# Patient Record
Sex: Male | Born: 1955 | Race: White | Hispanic: No | Marital: Married | Smoking: Never smoker
Health system: Southern US, Community
[De-identification: ages and names within clinical notes are randomized; demographics above are authoritative.]

## PROBLEM LIST (undated history)

## (undated) DIAGNOSIS — Z9911 Dependence on respirator [ventilator] status: Secondary | ICD-10-CM

## (undated) DIAGNOSIS — E119 Type 2 diabetes mellitus without complications: Secondary | ICD-10-CM

## (undated) DIAGNOSIS — Z87442 Personal history of urinary calculi: Secondary | ICD-10-CM

## (undated) DIAGNOSIS — G7000 Myasthenia gravis without (acute) exacerbation: Secondary | ICD-10-CM

## (undated) DIAGNOSIS — E669 Obesity, unspecified: Secondary | ICD-10-CM

## (undated) DIAGNOSIS — H539 Unspecified visual disturbance: Secondary | ICD-10-CM

## (undated) HISTORY — DX: Unspecified visual disturbance: H53.9

## (undated) HISTORY — DX: Type 2 diabetes mellitus without complications: E11.9

## (undated) HISTORY — DX: Dependence on respirator (ventilator) status: Z99.11

## (undated) HISTORY — DX: Personal history of urinary calculi: Z87.442

## (undated) HISTORY — DX: Obesity, unspecified: E66.9

## (undated) HISTORY — PX: THYMECTOMY: SHX1063

## (undated) HISTORY — DX: Myasthenia gravis without (acute) exacerbation: G70.00

## (undated) HISTORY — PX: TONSILLECTOMY: SUR1361

---

## 2008-11-30 ENCOUNTER — Inpatient Hospital Stay: Payer: Self-pay | Admitting: Internal Medicine

## 2008-12-03 ENCOUNTER — Emergency Department: Payer: Self-pay | Admitting: Unknown Physician Specialty

## 2008-12-03 ENCOUNTER — Inpatient Hospital Stay (HOSPITAL_COMMUNITY): Admission: EM | Admit: 2008-12-03 | Discharge: 2008-12-14 | Payer: Self-pay | Admitting: Pulmonary Disease

## 2008-12-03 ENCOUNTER — Ambulatory Visit: Payer: Self-pay | Admitting: Pulmonary Disease

## 2008-12-12 ENCOUNTER — Ambulatory Visit: Payer: Self-pay | Admitting: Thoracic Surgery

## 2008-12-15 ENCOUNTER — Encounter (HOSPITAL_COMMUNITY): Admission: RE | Admit: 2008-12-15 | Discharge: 2009-03-15 | Payer: Self-pay | Admitting: Neurology

## 2008-12-19 ENCOUNTER — Inpatient Hospital Stay (HOSPITAL_COMMUNITY): Admission: RE | Admit: 2008-12-19 | Discharge: 2008-12-23 | Payer: Self-pay | Admitting: Thoracic Surgery

## 2008-12-19 ENCOUNTER — Encounter: Payer: Self-pay | Admitting: Thoracic Surgery

## 2008-12-28 ENCOUNTER — Ambulatory Visit: Payer: Self-pay | Admitting: Thoracic Surgery

## 2008-12-28 ENCOUNTER — Encounter: Admission: RE | Admit: 2008-12-28 | Discharge: 2008-12-28 | Payer: Self-pay | Admitting: Thoracic Surgery

## 2009-01-12 ENCOUNTER — Ambulatory Visit (HOSPITAL_COMMUNITY): Admission: AD | Admit: 2009-01-12 | Discharge: 2009-01-12 | Payer: Self-pay | Admitting: Neurology

## 2009-01-18 ENCOUNTER — Encounter: Admission: RE | Admit: 2009-01-18 | Discharge: 2009-01-18 | Payer: Self-pay | Admitting: Thoracic Surgery

## 2009-01-18 ENCOUNTER — Ambulatory Visit: Payer: Self-pay | Admitting: Thoracic Surgery

## 2009-02-06 ENCOUNTER — Ambulatory Visit (HOSPITAL_COMMUNITY): Admission: RE | Admit: 2009-02-06 | Discharge: 2009-02-06 | Payer: Self-pay | Admitting: Neurology

## 2009-02-15 ENCOUNTER — Ambulatory Visit (HOSPITAL_COMMUNITY): Admission: RE | Admit: 2009-02-15 | Discharge: 2009-02-15 | Payer: Self-pay | Admitting: Neurology

## 2009-02-15 ENCOUNTER — Ambulatory Visit: Payer: Self-pay | Admitting: Thoracic Surgery

## 2009-02-15 ENCOUNTER — Encounter: Admission: RE | Admit: 2009-02-15 | Discharge: 2009-02-15 | Payer: Self-pay | Admitting: Thoracic Surgery

## 2009-04-19 ENCOUNTER — Encounter: Admission: RE | Admit: 2009-04-19 | Discharge: 2009-04-19 | Payer: Self-pay | Admitting: Thoracic Surgery

## 2009-04-19 ENCOUNTER — Ambulatory Visit: Payer: Self-pay | Admitting: Thoracic Surgery

## 2009-11-02 ENCOUNTER — Inpatient Hospital Stay (HOSPITAL_COMMUNITY): Admission: AD | Admit: 2009-11-02 | Discharge: 2009-11-06 | Payer: Self-pay | Admitting: Neurology

## 2009-11-07 ENCOUNTER — Encounter (HOSPITAL_COMMUNITY): Admission: RE | Admit: 2009-11-07 | Discharge: 2010-01-09 | Payer: Self-pay | Admitting: Neurology

## 2009-11-27 ENCOUNTER — Ambulatory Visit (HOSPITAL_COMMUNITY): Admission: RE | Admit: 2009-11-27 | Discharge: 2009-11-27 | Payer: Self-pay | Admitting: Neurology

## 2010-05-24 LAB — BASIC METABOLIC PANEL
BUN: 11 mg/dL (ref 6–23)
Calcium: 8.7 mg/dL (ref 8.4–10.5)
Creatinine, Ser: 1.1 mg/dL (ref 0.4–1.5)
GFR calc non Af Amer: >60 mL/min (ref >60)
Glucose, Bld: 230 mg/dL — ABNORMAL HIGH (ref 70–99)
Sodium: 139 mEq/L (ref 135–145)

## 2010-05-24 LAB — RENAL FUNCTION PANEL
Albumin: 4.1 g/dL (ref 3.5–5.2)
Chloride: 106 mEq/L (ref 96–112)
GFR calc Af Amer: >60 mL/min (ref >60)
GFR calc non Af Amer: >60 mL/min (ref >60)
Phosphorus: 1.7 mg/dL — ABNORMAL LOW (ref 2.3–4.6)
Potassium: 3.7 mEq/L (ref 3.5–5.1)
Sodium: 139 mEq/L (ref 135–145)

## 2010-05-24 LAB — CBC
HCT: 40.4 % (ref 39.0–52.0)
Hemoglobin: 13.7 g/dL (ref 13.0–17.0)
MCH: 34.3 pg — ABNORMAL HIGH (ref 26.0–34.0)
MCHC: 33.9 g/dL (ref 30.0–36.0)
MCV: 101.3 fL — ABNORMAL HIGH (ref 78.0–100.0)
Platelets: 139 K/uL — ABNORMAL LOW (ref 150–400)
Platelets: 156 10*3/uL (ref 150–400)
RBC: 3.99 MIL/uL — ABNORMAL LOW (ref 4.22–5.81)
RBC: 4.07 MIL/uL — ABNORMAL LOW (ref 4.22–5.81)
RDW: 13.8 % (ref 11.5–15.5)
WBC: 8.3 10*3/uL (ref 4.0–10.5)
WBC: 8.6 K/uL (ref 4.0–10.5)

## 2010-05-24 LAB — GLUCOSE, CAPILLARY: Glucose-Capillary: 160 mg/dL — ABNORMAL HIGH (ref 70–99)

## 2010-05-25 LAB — BASIC METABOLIC PANEL
BUN: 8 mg/dL (ref 6–23)
CO2: 28 mEq/L (ref 19–32)
Chloride: 105 mEq/L (ref 96–112)
Chloride: 110 mEq/L (ref 96–112)
Creatinine, Ser: 0.94 mg/dL (ref 0.4–1.5)
GFR calc Af Amer: >60 mL/min (ref >60)
GFR calc Af Amer: >60 mL/min (ref >60)
GFR calc non Af Amer: >60 mL/min (ref >60)
Potassium: 3.4 mEq/L — ABNORMAL LOW (ref 3.5–5.1)
Sodium: 138 mEq/L (ref 135–145)
Sodium: 141 mEq/L (ref 135–145)

## 2010-05-25 LAB — CBC
HCT: 42.1 % (ref 39.0–52.0)
HCT: 42.9 % (ref 39.0–52.0)
Hemoglobin: 14.2 g/dL (ref 13.0–17.0)
Hemoglobin: 14.7 g/dL (ref 13.0–17.0)
Hemoglobin: 15.9 g/dL (ref 13.0–17.0)
MCHC: 34.3 g/dL (ref 30.0–36.0)
MCHC: 34.4 g/dL (ref 30.0–36.0)
MCV: 100.5 fL — ABNORMAL HIGH (ref 78.0–100.0)
MCV: 99.1 fL (ref 78.0–100.0)
Platelets: 139 10*3/uL — ABNORMAL LOW (ref 150–400)
Platelets: 158 10*3/uL (ref 150–400)
Platelets: 189 10*3/uL (ref 150–400)
RBC: 4.26 MIL/uL (ref 4.22–5.81)
RBC: 4.59 MIL/uL (ref 4.22–5.81)
RDW: 13.7 % (ref 11.5–15.5)
RDW: 14 % (ref 11.5–15.5)
WBC: 10 10*3/uL (ref 4.0–10.5)
WBC: 7.9 10*3/uL (ref 4.0–10.5)

## 2010-05-25 LAB — GLUCOSE, CAPILLARY
Glucose-Capillary: 129 mg/dL — ABNORMAL HIGH (ref 70–99)
Glucose-Capillary: 140 mg/dL — ABNORMAL HIGH (ref 70–99)
Glucose-Capillary: 151 mg/dL — ABNORMAL HIGH (ref 70–99)
Glucose-Capillary: 158 mg/dL — ABNORMAL HIGH (ref 70–99)
Glucose-Capillary: 171 mg/dL — ABNORMAL HIGH (ref 70–99)
Glucose-Capillary: 206 mg/dL — ABNORMAL HIGH (ref 70–99)
Glucose-Capillary: 211 mg/dL — ABNORMAL HIGH (ref 70–99)

## 2010-05-25 LAB — COMPREHENSIVE METABOLIC PANEL
ALT: 15 U/L (ref 0–53)
ALT: 19 U/L (ref 0–53)
ALT: 44 U/L (ref 0–53)
AST: 29 U/L (ref 0–37)
AST: 32 U/L (ref 0–37)
AST: 52 U/L — ABNORMAL HIGH (ref 0–37)
Albumin: 3.7 g/dL (ref 3.5–5.2)
Albumin: 3.9 g/dL (ref 3.5–5.2)
Albumin: 4.3 g/dL (ref 3.5–5.2)
Alkaline Phosphatase: 19 U/L — ABNORMAL LOW (ref 39–117)
CO2: 23 mEq/L (ref 19–32)
CO2: 26 mEq/L (ref 19–32)
Calcium: 8.2 mg/dL — ABNORMAL LOW (ref 8.4–10.5)
Calcium: 9.2 mg/dL (ref 8.4–10.5)
Calcium: 9.3 mg/dL (ref 8.4–10.5)
Chloride: 103 mEq/L (ref 96–112)
Creatinine, Ser: 0.86 mg/dL (ref 0.4–1.5)
GFR calc Af Amer: >60 mL/min (ref >60)
GFR calc Af Amer: >60 mL/min (ref >60)
GFR calc Af Amer: >60 mL/min (ref >60)
GFR calc non Af Amer: >60 mL/min (ref >60)
GFR calc non Af Amer: >60 mL/min (ref >60)
Glucose, Bld: 169 mg/dL — ABNORMAL HIGH (ref 70–99)
Potassium: 3.8 mEq/L (ref 3.5–5.1)
Sodium: 137 mEq/L (ref 135–145)
Sodium: 138 mEq/L (ref 135–145)
Sodium: 139 mEq/L (ref 135–145)
Total Protein: 5.3 g/dL — ABNORMAL LOW (ref 6.0–8.3)
Total Protein: 5.6 g/dL — ABNORMAL LOW (ref 6.0–8.3)

## 2010-05-25 LAB — DIFFERENTIAL
Eosinophils Absolute: 0 10*3/uL (ref 0.0–0.7)
Eosinophils Relative: 0 % (ref 0–5)
Lymphocytes Relative: 6 % — ABNORMAL LOW (ref 12–46)
Lymphs Abs: 0.4 10*3/uL — ABNORMAL LOW (ref 0.7–4.0)
Monocytes Absolute: 0.6 10*3/uL (ref 0.1–1.0)

## 2010-05-25 LAB — PROTIME-INR: Prothrombin Time: 13.8 seconds (ref 11.6–15.2)

## 2010-05-25 LAB — HEMOGLOBIN A1C
Hgb A1c MFr Bld: 8 % — ABNORMAL HIGH (ref <5.7)
Mean Plasma Glucose: 183 mg/dL — ABNORMAL HIGH (ref <117)

## 2010-06-13 LAB — DIFFERENTIAL
Eosinophils Absolute: 0.1 10*3/uL (ref 0.0–0.7)
Eosinophils Relative: 1 % (ref 0–5)
Lymphocytes Relative: 8 % — ABNORMAL LOW (ref 12–46)
Lymphs Abs: 0.8 10*3/uL (ref 0.7–4.0)
Monocytes Absolute: 0.6 10*3/uL (ref 0.1–1.0)
Monocytes Relative: 6 % (ref 3–12)

## 2010-06-13 LAB — COMPREHENSIVE METABOLIC PANEL
ALT: 17 U/L (ref 0–53)
AST: 19 U/L (ref 0–37)
Albumin: 3.5 g/dL (ref 3.5–5.2)
CO2: 25 mEq/L (ref 19–32)
Calcium: 8.5 mg/dL (ref 8.4–10.5)
Total Protein: 6.3 g/dL (ref 6.0–8.3)

## 2010-06-13 LAB — CBC
HCT: 38.8 % — ABNORMAL LOW (ref 39.0–52.0)
Hemoglobin: 13.2 g/dL (ref 13.0–17.0)
MCV: 101.3 fL — ABNORMAL HIGH (ref 78.0–100.0)
RDW: 15.7 % — ABNORMAL HIGH (ref 11.5–15.5)
WBC: 10.1 10*3/uL (ref 4.0–10.5)

## 2010-06-14 LAB — COMPREHENSIVE METABOLIC PANEL
ALT: 43 U/L (ref 0–53)
AST: 20 U/L (ref 0–37)
AST: 42 U/L — ABNORMAL HIGH (ref 0–37)
AST: 71 U/L — ABNORMAL HIGH (ref 0–37)
Albumin: 4.2 g/dL (ref 3.5–5.2)
BUN: 12 mg/dL (ref 6–23)
BUN: 8 mg/dL (ref 6–23)
CO2: 25 mEq/L (ref 19–32)
CO2: 30 mEq/L (ref 19–32)
Calcium: 8.2 mg/dL — ABNORMAL LOW (ref 8.4–10.5)
Calcium: 8.9 mg/dL (ref 8.4–10.5)
Chloride: 103 mEq/L (ref 96–112)
Chloride: 106 mEq/L (ref 96–112)
Creatinine, Ser: 0.72 mg/dL (ref 0.4–1.5)
Creatinine, Ser: 0.78 mg/dL (ref 0.4–1.5)
Creatinine, Ser: 0.92 mg/dL (ref 0.4–1.5)
Creatinine, Ser: 0.94 mg/dL (ref 0.4–1.5)
GFR calc Af Amer: >60 mL/min (ref >60)
GFR calc Af Amer: >60 mL/min (ref >60)
GFR calc Af Amer: >60 mL/min (ref >60)
GFR calc non Af Amer: >60 mL/min (ref >60)
GFR calc non Af Amer: >60 mL/min (ref >60)
Glucose, Bld: 132 mg/dL — ABNORMAL HIGH (ref 70–99)
Glucose, Bld: 157 mg/dL — ABNORMAL HIGH (ref 70–99)
Glucose, Bld: 210 mg/dL — ABNORMAL HIGH (ref 70–99)
Potassium: 3.3 mEq/L — ABNORMAL LOW (ref 3.5–5.1)
Sodium: 138 mEq/L (ref 135–145)
Total Bilirubin: 0.8 mg/dL (ref 0.3–1.2)
Total Bilirubin: 0.9 mg/dL (ref 0.3–1.2)
Total Protein: 5.4 g/dL — ABNORMAL LOW (ref 6.0–8.3)
Total Protein: 5.4 g/dL — ABNORMAL LOW (ref 6.0–8.3)

## 2010-06-14 LAB — GLUCOSE, CAPILLARY
Glucose-Capillary: 111 mg/dL — ABNORMAL HIGH (ref 70–99)
Glucose-Capillary: 123 mg/dL — ABNORMAL HIGH (ref 70–99)
Glucose-Capillary: 134 mg/dL — ABNORMAL HIGH (ref 70–99)
Glucose-Capillary: 147 mg/dL — ABNORMAL HIGH (ref 70–99)
Glucose-Capillary: 150 mg/dL — ABNORMAL HIGH (ref 70–99)
Glucose-Capillary: 161 mg/dL — ABNORMAL HIGH (ref 70–99)
Glucose-Capillary: 181 mg/dL — ABNORMAL HIGH (ref 70–99)
Glucose-Capillary: 199 mg/dL — ABNORMAL HIGH (ref 70–99)

## 2010-06-14 LAB — BASIC METABOLIC PANEL
BUN: 5 mg/dL — ABNORMAL LOW (ref 6–23)
BUN: 9 mg/dL (ref 6–23)
BUN: 9 mg/dL (ref 6–23)
CO2: 26 mEq/L (ref 19–32)
CO2: 28 mEq/L (ref 19–32)
CO2: 29 mEq/L (ref 19–32)
Calcium: 8.1 mg/dL — ABNORMAL LOW (ref 8.4–10.5)
Calcium: 8.3 mg/dL — ABNORMAL LOW (ref 8.4–10.5)
Calcium: 8.6 mg/dL (ref 8.4–10.5)
Chloride: 103 mEq/L (ref 96–112)
Chloride: 103 mEq/L (ref 96–112)
Chloride: 108 mEq/L (ref 96–112)
Creatinine, Ser: 0.79 mg/dL (ref 0.4–1.5)
Creatinine, Ser: 0.82 mg/dL (ref 0.4–1.5)
Creatinine, Ser: 0.84 mg/dL (ref 0.4–1.5)
Creatinine, Ser: 0.86 mg/dL (ref 0.4–1.5)
GFR calc Af Amer: >60 mL/min (ref >60)
GFR calc Af Amer: >60 mL/min (ref >60)
GFR calc Af Amer: >60 mL/min (ref >60)
GFR calc Af Amer: >60 mL/min (ref >60)
GFR calc Af Amer: >60 mL/min (ref >60)
GFR calc non Af Amer: >60 mL/min (ref >60)
GFR calc non Af Amer: >60 mL/min (ref >60)
GFR calc non Af Amer: >60 mL/min (ref >60)
Glucose, Bld: 115 mg/dL — ABNORMAL HIGH (ref 70–99)
Glucose, Bld: 128 mg/dL — ABNORMAL HIGH (ref 70–99)
Glucose, Bld: 151 mg/dL — ABNORMAL HIGH (ref 70–99)
Potassium: 3.5 mEq/L (ref 3.5–5.1)
Potassium: 3.7 mEq/L (ref 3.5–5.1)
Potassium: 4.4 mEq/L (ref 3.5–5.1)
Sodium: 136 mEq/L (ref 135–145)
Sodium: 139 mEq/L (ref 135–145)
Sodium: 141 mEq/L (ref 135–145)

## 2010-06-14 LAB — CBC
HCT: 30.5 % — ABNORMAL LOW (ref 39.0–52.0)
HCT: 30.9 % — ABNORMAL LOW (ref 39.0–52.0)
HCT: 36.5 % — ABNORMAL LOW (ref 39.0–52.0)
HCT: 37.2 % — ABNORMAL LOW (ref 39.0–52.0)
Hemoglobin: 10.6 g/dL — ABNORMAL LOW (ref 13.0–17.0)
Hemoglobin: 10.6 g/dL — ABNORMAL LOW (ref 13.0–17.0)
Hemoglobin: 11.2 g/dL — ABNORMAL LOW (ref 13.0–17.0)
Hemoglobin: 12.3 g/dL — ABNORMAL LOW (ref 13.0–17.0)
Hemoglobin: 12.6 g/dL — ABNORMAL LOW (ref 13.0–17.0)
MCHC: 33.2 g/dL (ref 30.0–36.0)
MCHC: 33.5 g/dL (ref 30.0–36.0)
MCHC: 33.8 g/dL (ref 30.0–36.0)
MCHC: 33.8 g/dL (ref 30.0–36.0)
MCHC: 34.1 g/dL (ref 30.0–36.0)
MCHC: 34.5 g/dL (ref 30.0–36.0)
MCV: 101.9 fL — ABNORMAL HIGH (ref 78.0–100.0)
MCV: 102.2 fL — ABNORMAL HIGH (ref 78.0–100.0)
MCV: 102.6 fL — ABNORMAL HIGH (ref 78.0–100.0)
MCV: 102.6 fL — ABNORMAL HIGH (ref 78.0–100.0)
MCV: 102.6 fL — ABNORMAL HIGH (ref 78.0–100.0)
MCV: 102.7 fL — ABNORMAL HIGH (ref 78.0–100.0)
Platelets: 111 10*3/uL — ABNORMAL LOW (ref 150–400)
Platelets: 114 10*3/uL — ABNORMAL LOW (ref 150–400)
Platelets: 143 10*3/uL — ABNORMAL LOW (ref 150–400)
Platelets: 144 10*3/uL — ABNORMAL LOW (ref 150–400)
Platelets: 149 10*3/uL — ABNORMAL LOW (ref 150–400)
Platelets: 182 10*3/uL (ref 150–400)
RBC: 3 MIL/uL — ABNORMAL LOW (ref 4.22–5.81)
RBC: 3.07 MIL/uL — ABNORMAL LOW (ref 4.22–5.81)
RBC: 3.15 MIL/uL — ABNORMAL LOW (ref 4.22–5.81)
RBC: 3.26 MIL/uL — ABNORMAL LOW (ref 4.22–5.81)
RBC: 3.48 MIL/uL — ABNORMAL LOW (ref 4.22–5.81)
RBC: 3.56 MIL/uL — ABNORMAL LOW (ref 4.22–5.81)
RBC: 3.61 MIL/uL — ABNORMAL LOW (ref 4.22–5.81)
RBC: 3.65 MIL/uL — ABNORMAL LOW (ref 4.22–5.81)
RDW: 14.8 % (ref 11.5–15.5)
RDW: 14.8 % (ref 11.5–15.5)
RDW: 15.6 % — ABNORMAL HIGH (ref 11.5–15.5)
RDW: 16.1 % — ABNORMAL HIGH (ref 11.5–15.5)
WBC: 11.9 10*3/uL — ABNORMAL HIGH (ref 4.0–10.5)
WBC: 12.1 10*3/uL — ABNORMAL HIGH (ref 4.0–10.5)
WBC: 12.5 10*3/uL — ABNORMAL HIGH (ref 4.0–10.5)
WBC: 9 10*3/uL (ref 4.0–10.5)
WBC: 9.3 10*3/uL (ref 4.0–10.5)
WBC: 9.7 10*3/uL (ref 4.0–10.5)
WBC: 9.7 10*3/uL (ref 4.0–10.5)

## 2010-06-14 LAB — POCT I-STAT 3, ART BLOOD GAS (G3+)
O2 Saturation: 98 %
Patient temperature: 97.6
Patient temperature: 98.6
TCO2: 25 mmol/L (ref 0–100)
TCO2: 31 mmol/L (ref 0–100)
pCO2 arterial: 38 mmHg (ref 35.0–45.0)
pCO2 arterial: 45.4 mmHg — ABNORMAL HIGH (ref 35.0–45.0)
pH, Arterial: 7.426 (ref 7.350–7.450)
pO2, Arterial: 103 mmHg — ABNORMAL HIGH (ref 80.0–100.0)

## 2010-06-14 LAB — APTT
aPTT: 28 seconds (ref 24–37)
aPTT: 39 seconds — ABNORMAL HIGH (ref 24–37)
aPTT: 42 seconds — ABNORMAL HIGH (ref 24–37)
aPTT: >200 seconds (ref 24–37)

## 2010-06-14 LAB — CROSSMATCH: Antibody Screen: NEGATIVE

## 2010-06-14 LAB — PROTIME-INR
INR: 1.08 (ref 0.00–1.49)
INR: 1.5 (ref 0.00–1.49)
INR: 2.38 — ABNORMAL HIGH (ref 0.00–1.49)
Prothrombin Time: 17.2 seconds — ABNORMAL HIGH (ref 11.6–15.2)
Prothrombin Time: 17.5 seconds — ABNORMAL HIGH (ref 11.6–15.2)
Prothrombin Time: 25.8 seconds — ABNORMAL HIGH (ref 11.6–15.2)

## 2010-06-14 LAB — MRSA PCR SCREENING: MRSA by PCR: NEGATIVE

## 2010-06-14 LAB — BLOOD GAS, ARTERIAL
Drawn by: 318431
FIO2: 0.21 %
Patient temperature: 98.6
pCO2 arterial: 37.9 mmHg (ref 35.0–45.0)
pH, Arterial: 7.434 (ref 7.350–7.450)

## 2010-06-14 LAB — ABO/RH: ABO/RH(D): A POS

## 2010-06-14 LAB — PREPARE FRESH FROZEN PLASMA

## 2010-06-14 LAB — FIBRINOGEN
Fibrinogen: 109 mg/dL — ABNORMAL LOW (ref 204–475)
Fibrinogen: 96 mg/dL — CL (ref 204–475)

## 2010-06-15 LAB — BASIC METABOLIC PANEL
CO2: 25 mEq/L (ref 19–32)
CO2: 25 mEq/L (ref 19–32)
Calcium: 8.2 mg/dL — ABNORMAL LOW (ref 8.4–10.5)
Calcium: 8.5 mg/dL (ref 8.4–10.5)
Calcium: 8.7 mg/dL (ref 8.4–10.5)
Chloride: 101 mEq/L (ref 96–112)
Chloride: 111 mEq/L (ref 96–112)
Creatinine, Ser: 0.8 mg/dL (ref 0.4–1.5)
GFR calc Af Amer: >60 mL/min (ref >60)
GFR calc Af Amer: >60 mL/min (ref >60)
GFR calc Af Amer: >60 mL/min (ref >60)
Sodium: 138 mEq/L (ref 135–145)
Sodium: 143 mEq/L (ref 135–145)

## 2010-06-15 LAB — COMPREHENSIVE METABOLIC PANEL
Albumin: 3.5 g/dL (ref 3.5–5.2)
Alkaline Phosphatase: 13 U/L — ABNORMAL LOW (ref 39–117)
BUN: 11 mg/dL (ref 6–23)
BUN: 8 mg/dL (ref 6–23)
CO2: 26 mEq/L (ref 19–32)
Calcium: 8 mg/dL — ABNORMAL LOW (ref 8.4–10.5)
Calcium: 8.5 mg/dL (ref 8.4–10.5)
Creatinine, Ser: 0.7 mg/dL (ref 0.4–1.5)
GFR calc non Af Amer: >60 mL/min (ref >60)
Glucose, Bld: 110 mg/dL — ABNORMAL HIGH (ref 70–99)
Glucose, Bld: 141 mg/dL — ABNORMAL HIGH (ref 70–99)
Potassium: 3.4 mEq/L — ABNORMAL LOW (ref 3.5–5.1)
Total Protein: 5 g/dL — ABNORMAL LOW (ref 6.0–8.3)
Total Protein: 5.1 g/dL — ABNORMAL LOW (ref 6.0–8.3)

## 2010-06-15 LAB — DIFFERENTIAL
Basophils Absolute: 0 10*3/uL (ref 0.0–0.1)
Lymphocytes Relative: 3 % — ABNORMAL LOW (ref 12–46)
Neutro Abs: 11.5 10*3/uL — ABNORMAL HIGH (ref 1.7–7.7)
Neutrophils Relative %: 91 % — ABNORMAL HIGH (ref 43–77)

## 2010-06-15 LAB — POCT I-STAT 3, ART BLOOD GAS (G3+)
Bicarbonate: 24.4 mEq/L — ABNORMAL HIGH (ref 20.0–24.0)
Bicarbonate: 25.4 mEq/L — ABNORMAL HIGH (ref 20.0–24.0)
O2 Saturation: 96 %
O2 Saturation: 99 %
TCO2: 25 mmol/L (ref 0–100)
TCO2: 26 mmol/L (ref 0–100)
pCO2 arterial: 36.4 mmHg (ref 35.0–45.0)
pCO2 arterial: 37.9 mmHg (ref 35.0–45.0)
pCO2 arterial: 38.8 mmHg (ref 35.0–45.0)
pH, Arterial: 7.433 (ref 7.350–7.450)
pO2, Arterial: 120 mmHg — ABNORMAL HIGH (ref 80.0–100.0)
pO2, Arterial: 83 mmHg (ref 80.0–100.0)

## 2010-06-15 LAB — CBC
HCT: 36.8 % — ABNORMAL LOW (ref 39.0–52.0)
HCT: 40 % (ref 39.0–52.0)
Hemoglobin: 12.6 g/dL — ABNORMAL LOW (ref 13.0–17.0)
Hemoglobin: 13 g/dL (ref 13.0–17.0)
Hemoglobin: 13.6 g/dL (ref 13.0–17.0)
Hemoglobin: 14.8 g/dL (ref 13.0–17.0)
MCHC: 33.9 g/dL (ref 30.0–36.0)
MCHC: 34 g/dL (ref 30.0–36.0)
MCHC: 34.2 g/dL (ref 30.0–36.0)
MCHC: 34.4 g/dL (ref 30.0–36.0)
MCV: 101.2 fL — ABNORMAL HIGH (ref 78.0–100.0)
MCV: 101.3 fL — ABNORMAL HIGH (ref 78.0–100.0)
Platelets: 108 10*3/uL — ABNORMAL LOW (ref 150–400)
Platelets: 168 10*3/uL (ref 150–400)
RBC: 3.79 MIL/uL — ABNORMAL LOW (ref 4.22–5.81)
RBC: 4.28 MIL/uL (ref 4.22–5.81)
RBC: 4.45 MIL/uL (ref 4.22–5.81)
RDW: 14.1 % (ref 11.5–15.5)
RDW: 14.4 % (ref 11.5–15.5)
RDW: 14.9 % (ref 11.5–15.5)
WBC: 11.9 10*3/uL — ABNORMAL HIGH (ref 4.0–10.5)
WBC: 11.9 10*3/uL — ABNORMAL HIGH (ref 4.0–10.5)
WBC: 12.8 10*3/uL — ABNORMAL HIGH (ref 4.0–10.5)

## 2010-06-15 LAB — CULTURE, BAL-QUANTITATIVE W GRAM STAIN
Colony Count: NO GROWTH
Culture: NO GROWTH

## 2010-06-15 LAB — CULTURE, BLOOD (ROUTINE X 2): Culture: NO GROWTH

## 2010-06-15 LAB — HIV ANTIBODY (ROUTINE TESTING W REFLEX): HIV: NONREACTIVE

## 2010-06-15 LAB — HEPATITIS C ANTIBODY: HCV Ab: NEGATIVE

## 2010-06-15 LAB — GLUCOSE, CAPILLARY: Glucose-Capillary: 160 mg/dL — ABNORMAL HIGH (ref 70–99)

## 2010-06-15 LAB — RETICULOCYTES: Retic Ct Pct: 0.7 % (ref 0.4–3.1)

## 2010-06-15 LAB — PHOSPHORUS: Phosphorus: 3.8 mg/dL (ref 2.3–4.6)

## 2010-06-15 LAB — MAGNESIUM: Magnesium: 2.2 mg/dL (ref 1.5–2.5)

## 2010-06-15 LAB — FIBRINOGEN: Fibrinogen: 345 mg/dL (ref 204–475)

## 2010-07-24 NOTE — Assessment & Plan Note (Signed)
OFFICE VISIT   QUENTEZ, LOBER  DOB:  1955-09-22                                        April 19, 2009  CHART #:  [id number]   The patient came for followup today.  His prednisone has been reduced  and he is doing much well with his myasthenia gravis.  His chest x-ray  is stable.  His incision is healed.  There is some widening of the  incision inferiorly, but it looks like it is healing fine.  I will see  him again if he has any future problems.  He looks like he has gotten an  excellent result from his thymectomy.   Ines Bloomer, M.D.  Electronically Signed   DPB/MEDQ  D:  04/19/2009  T:  04/19/2009  Job:  [id number]

## 2010-07-24 NOTE — Letter (Signed)
December 28, 2008   C. Lesia Sago, MD  [address]  [address]  [place], [place] [address]   Re:  JOHNNEY, Randall Phillips               DOB:  1955-09-24   Dear Mellody Dance,   I saw the patient back in the office today.  He is doing well after his  median sternotomy.  His incisions are well healed.  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  He is still having some moderate pain.  We removed his chest tube  stitch.  I will see him back again in 4 weeks with a chest x-ray.  It is  interesting the pathology showed very little thymus, mainly just adipose  tissue, but the thymus was calcified and involuted.  I appreciate the  opportunity of seeing the patient.   Sincerely,   Ines Bloomer, M.D.  Electronically Signed   DPB/MEDQ  D:  12/28/2008  T:  12/28/2008  Job:  [id number]

## 2010-07-24 NOTE — Letter (Signed)
January 18, 2009   C. Lesia Sago, MD  [address]  [address]  [place], [place] [address]   Re:  Randall Phillips, Randall Phillips               DOB:  1955-06-13   Dear Dr. Anne Hahn:   I saw the patient back today.  His incision is doing well from his  median sternotomy.  His sternum is stable.  His blood pressure was  138/88, pulse 100, respirations 18, and sats were 96%.  He still is  getting intermittent plasmapheresis and his catheter seems to be  satisfactory in place.  I plan to see him back again in 4 weeks with a  chest x-ray for a final check.  I have released him to return to work  next week.   Sincerely,   Ines Bloomer, M.D.  Electronically Signed   DPB/MEDQ  D:  01/18/2009  T:  01/18/2009  Job:  [id number]

## 2010-07-24 NOTE — Letter (Signed)
February 15, 2009   C. Lesia Sago, MD  [address]  [place], [place] [address]   Re:  Randall Phillips, Randall Phillips               DOB:  [birth year]   Dear Mellody Dance:   I saw the patient back in the office today.  He is doing well overall.  His incisions are well healed.  Apparently, he is going to get his  Diatek out today.  His blood pressure was 140/90, pulse 100,  respirations 16, and sats were 90%.  I will see him back again in 2  months for final check if everything looks good.   Sincerely,   Ines Bloomer, M.D.  Electronically Signed   DPB/MEDQ  D:  02/15/2009  T:  02/15/2009  Job:  [id number]

## 2010-11-10 IMAGING — CR DG CHEST 1V PORT
1 series · 1 of 1 positions shown · non-contrast
Comparison: none

REASON FOR EXAM: COUGH
COMMENTS:

PROCEDURE:     DXR - DXR PORTABLE CHEST SINGLE VIEW  - November 30, 2008  [DATE]
RESULT:     The lungs are well-expanded. There is no focal infiltrate. The
cardiac silhouette is normal in size. The pulmonary vascularity is not
engorged. There is no pleural effusion.

[view not recorded]
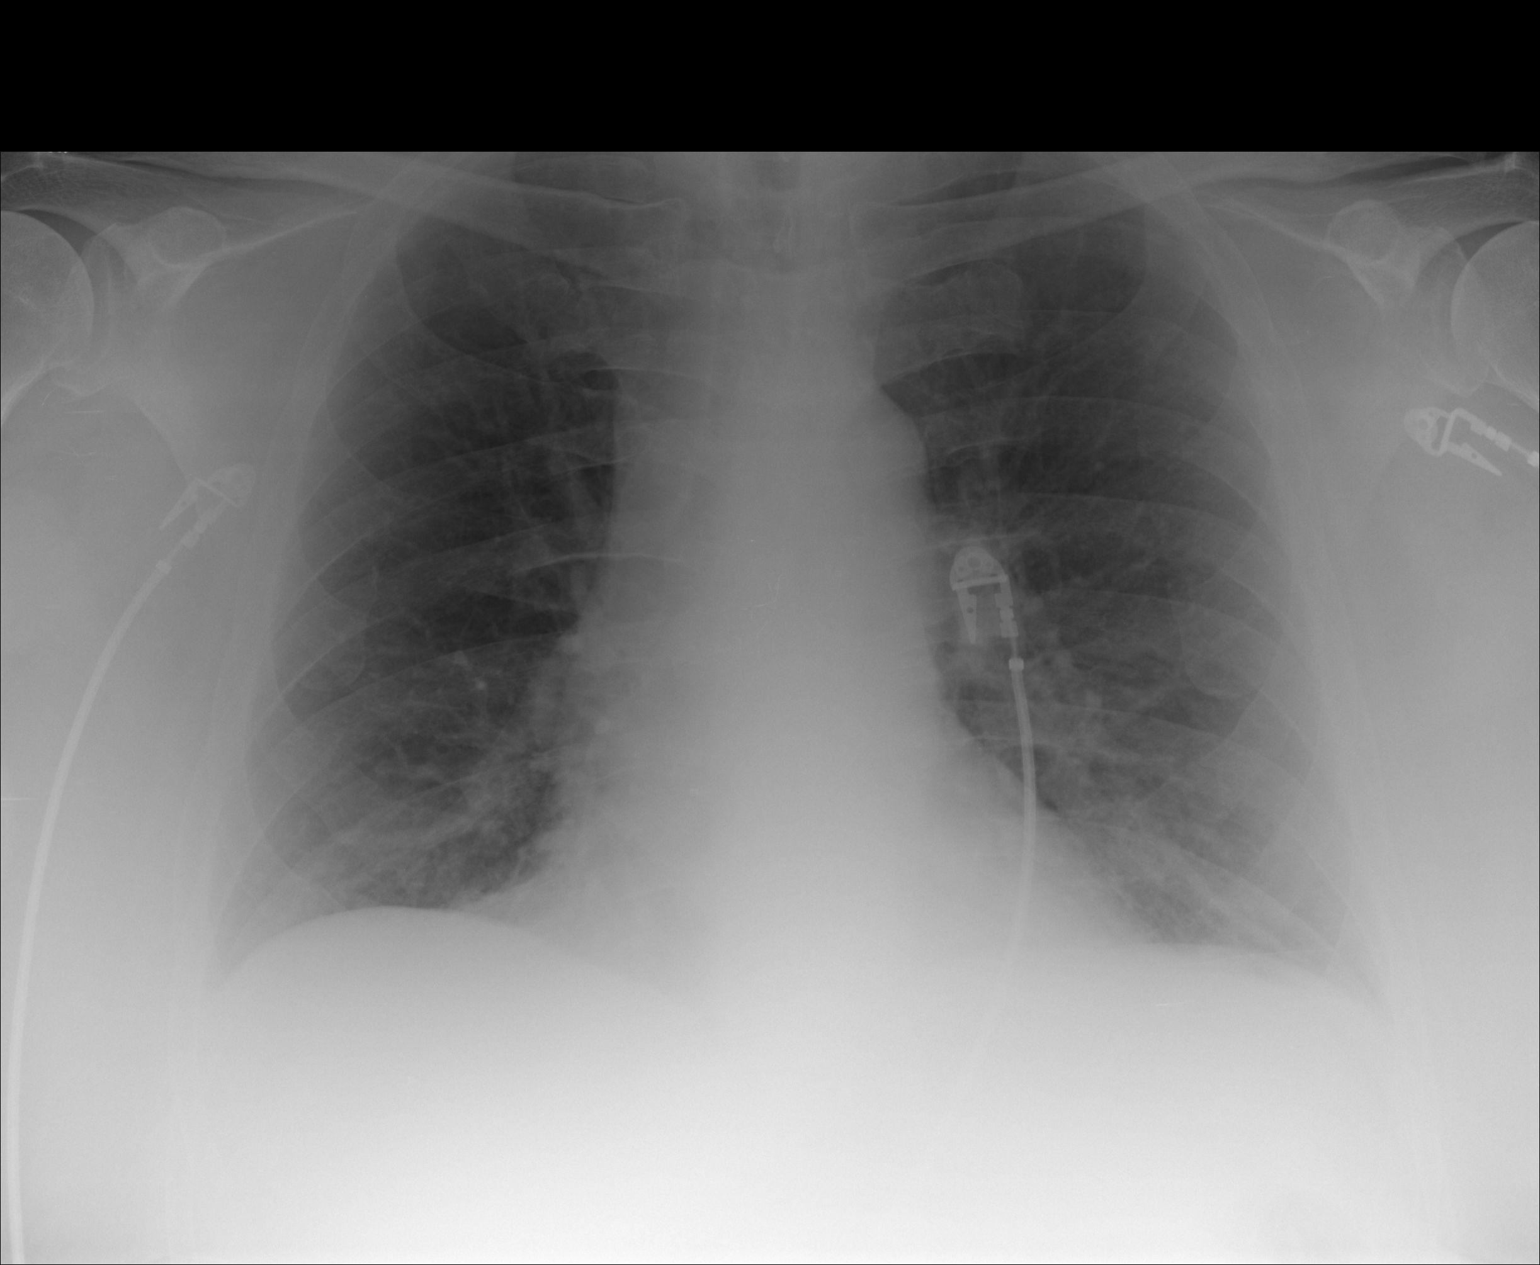

[1 of 1 positions shown; findings below may reference images not displayed]

IMPRESSION: I do not see evidence of pneumonia nor CHF. if the
patient's cough persists, a followup PA and lateral chest x-ray would be of
value.

## 2010-11-13 IMAGING — CR DG CHEST 1V PORT
1 series · 1 of 1 positions shown · non-contrast
Comparison: none

REASON FOR EXAM: post intubation      rm 11
COMMENTS:

[view not recorded]
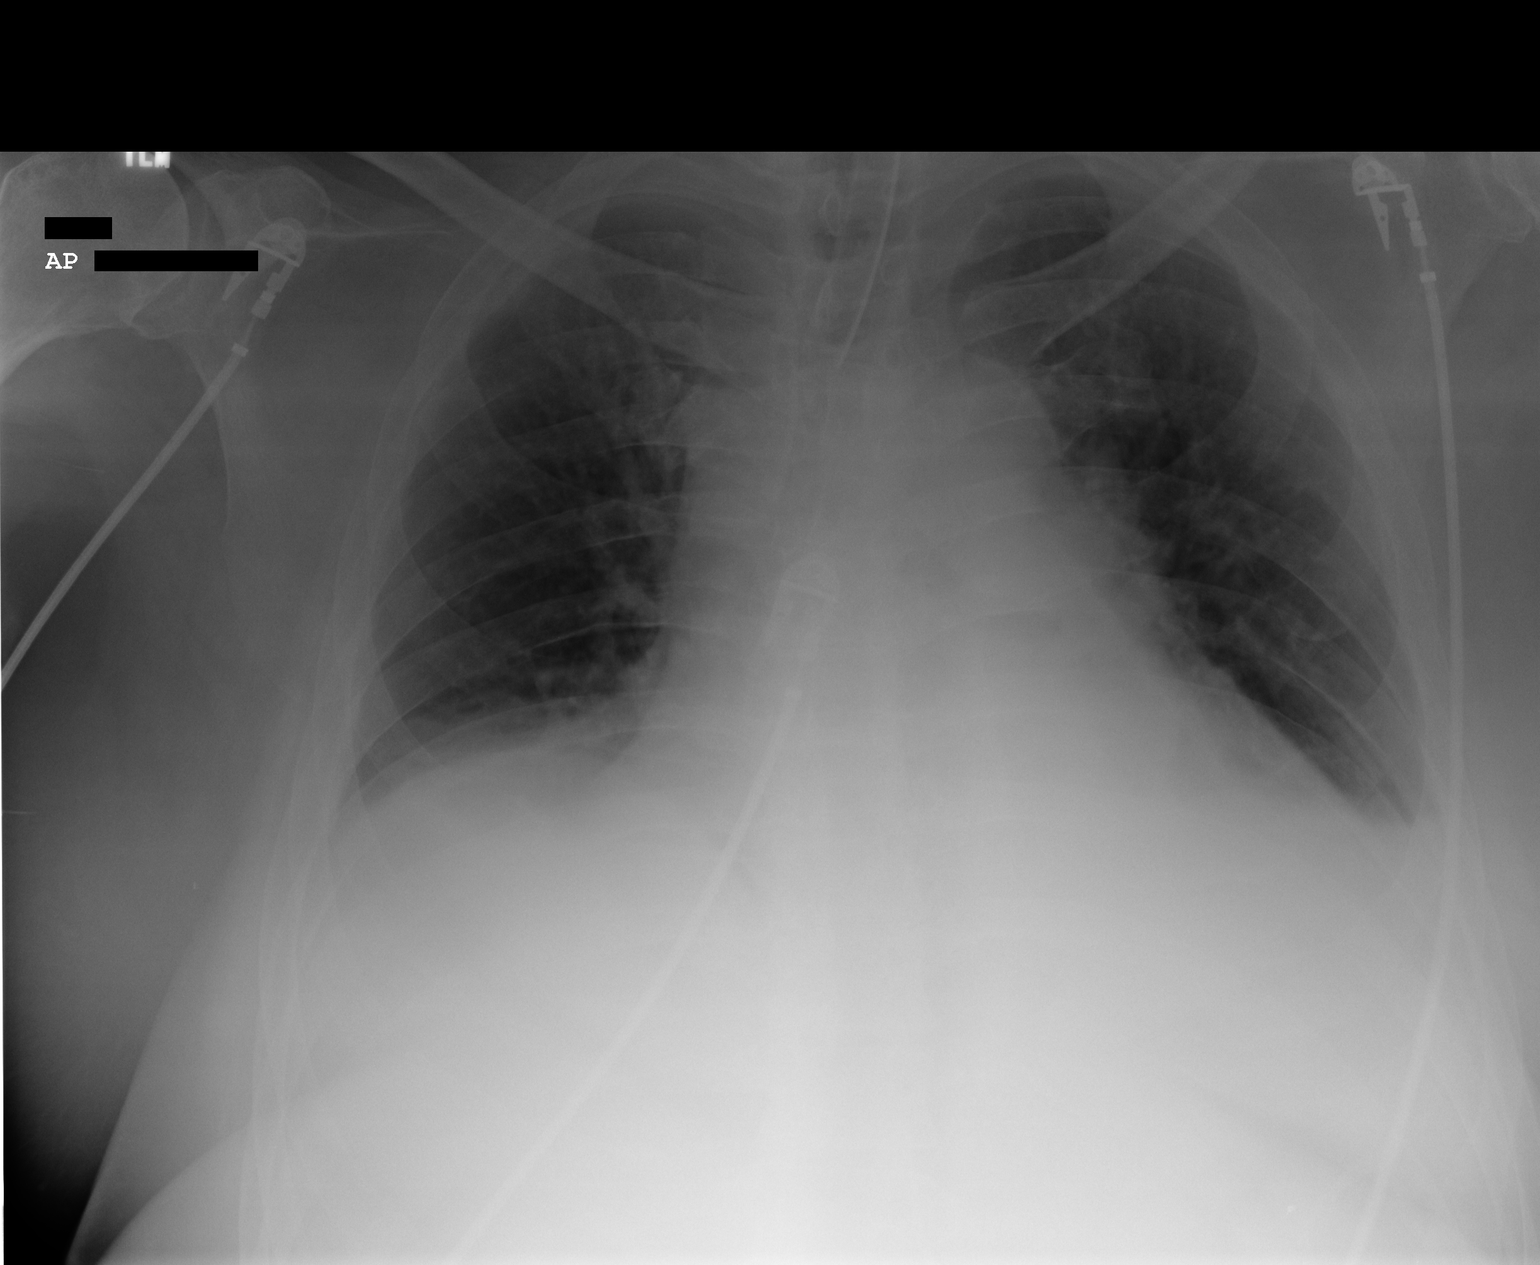

[1 of 1 positions shown; findings below may reference images not displayed]

PROCEDURE:     DXR - DXR PORTABLE CHEST SINGLE VIEW  - December 03, 2008  [DATE]

RESULT:     Comparison is made to study 03 December, 2008 at [DATE] a.m.

There has been interval intubation of the trachea. The tip of the
endotracheal tube lies between the clavicular heads. The lungs are
hypoinflated. There is density in the retrocardiac region on the left and
the left lateral costophrenic gutter is blunted. The pulmonary vascularity
exhibits crowding.
IMPRESSION: The lungs remain hypoinflated despite intubation. There may
be low-grade CHF present. Left lower lobe atelectasis and a small left
pleural effusion are suspected.

## 2010-11-14 IMAGING — CR DG CHEST 1V PORT
1 series · 1 of 1 positions shown · non-contrast
Comparison: 12/03/2008

CLINICAL DATA: Respiratory distress

PORTABLE CHEST - 1 VIEW

[view not recorded]
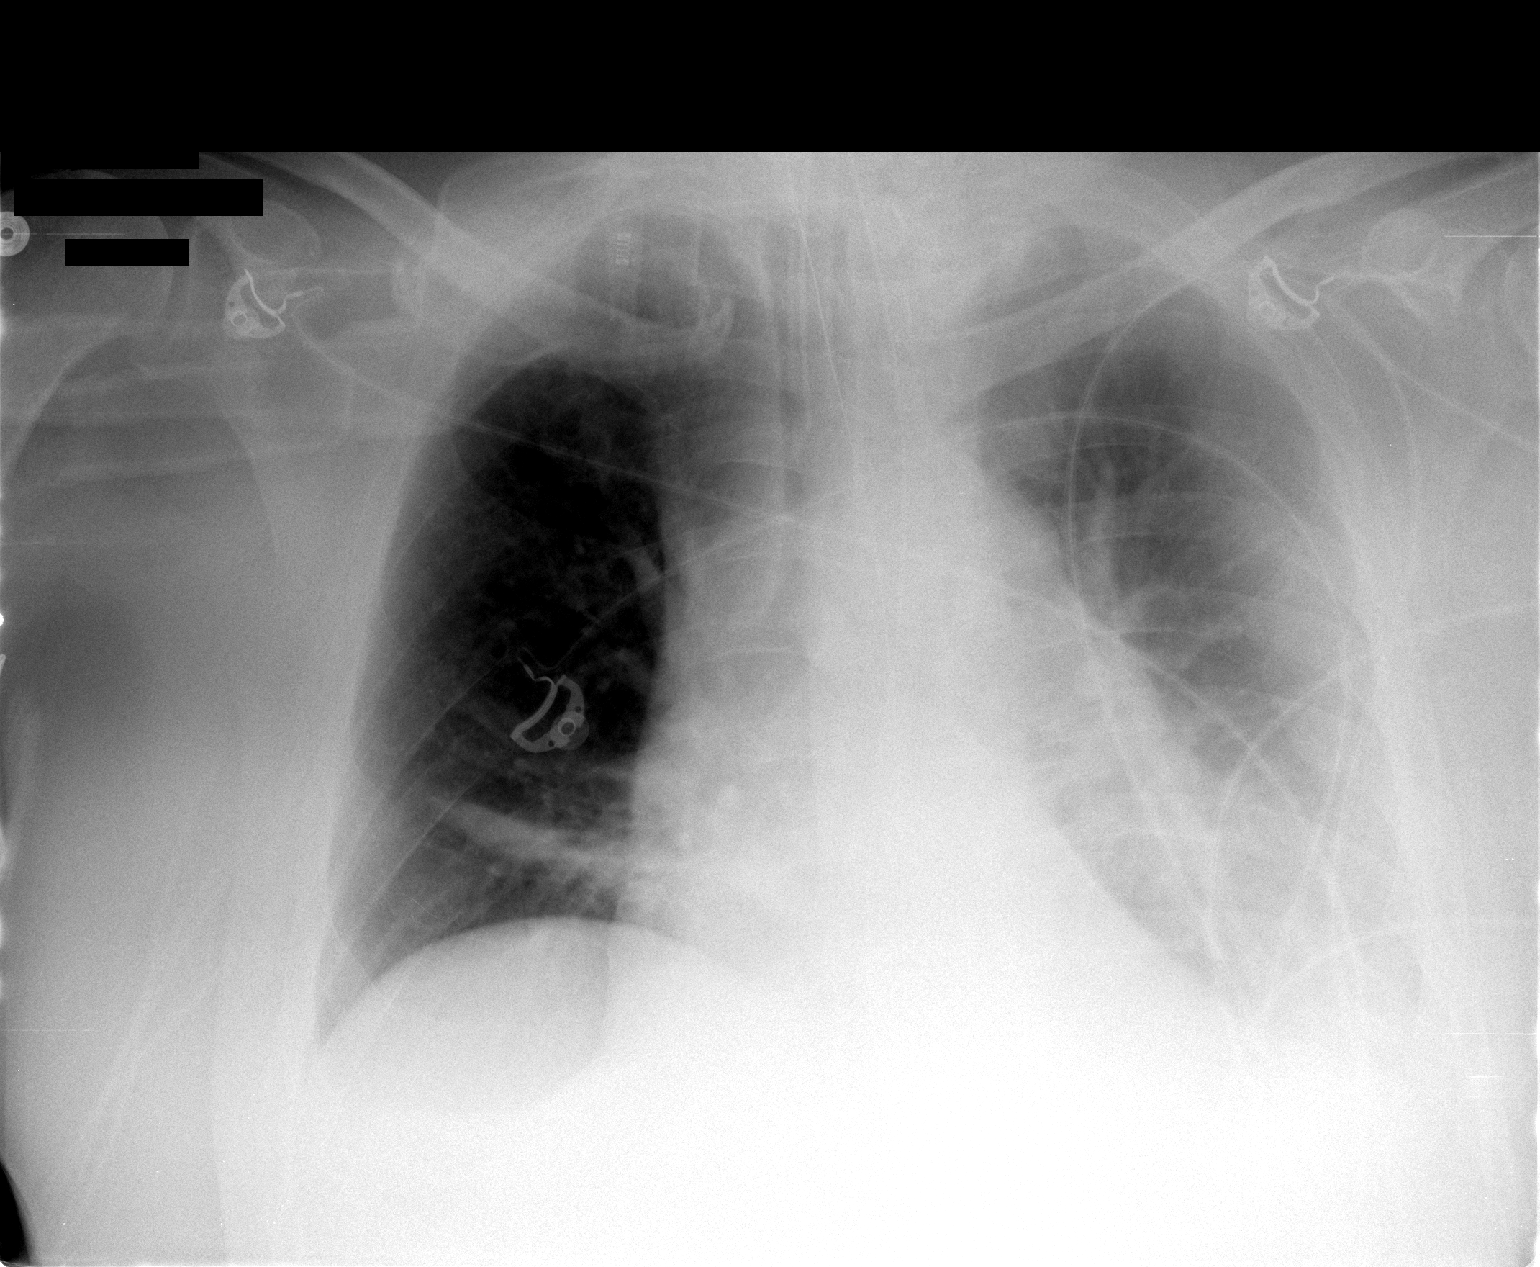

[1 of 1 positions shown; findings below may reference images not displayed]

FINDINGS: Subsegmental atelectasis at the left base.  Left lower
lobe atelectasis / consolidation.  ET tube position satisfactory.
NG tube is noted traversing esophagus.
IMPRESSION: Atelectasis at the bases.  No interval change.

## 2010-11-17 IMAGING — CR DG CHEST 1V PORT
1 series · 1 of 1 positions shown · non-contrast
Comparison: Portable chest x-ray earlier in the [AGE] hours.  CT
chest earlier today 8383 hours.  X-ray yesterday.

CLINICAL DATA: Myasthenia gravis.  Follow up basilar atelectasis
versus pneumonia.  Ventilator dependent respiratory failure.

PORTABLE CHEST - 1 VIEW [DATE]/2606 0066 hours:

[view not recorded]
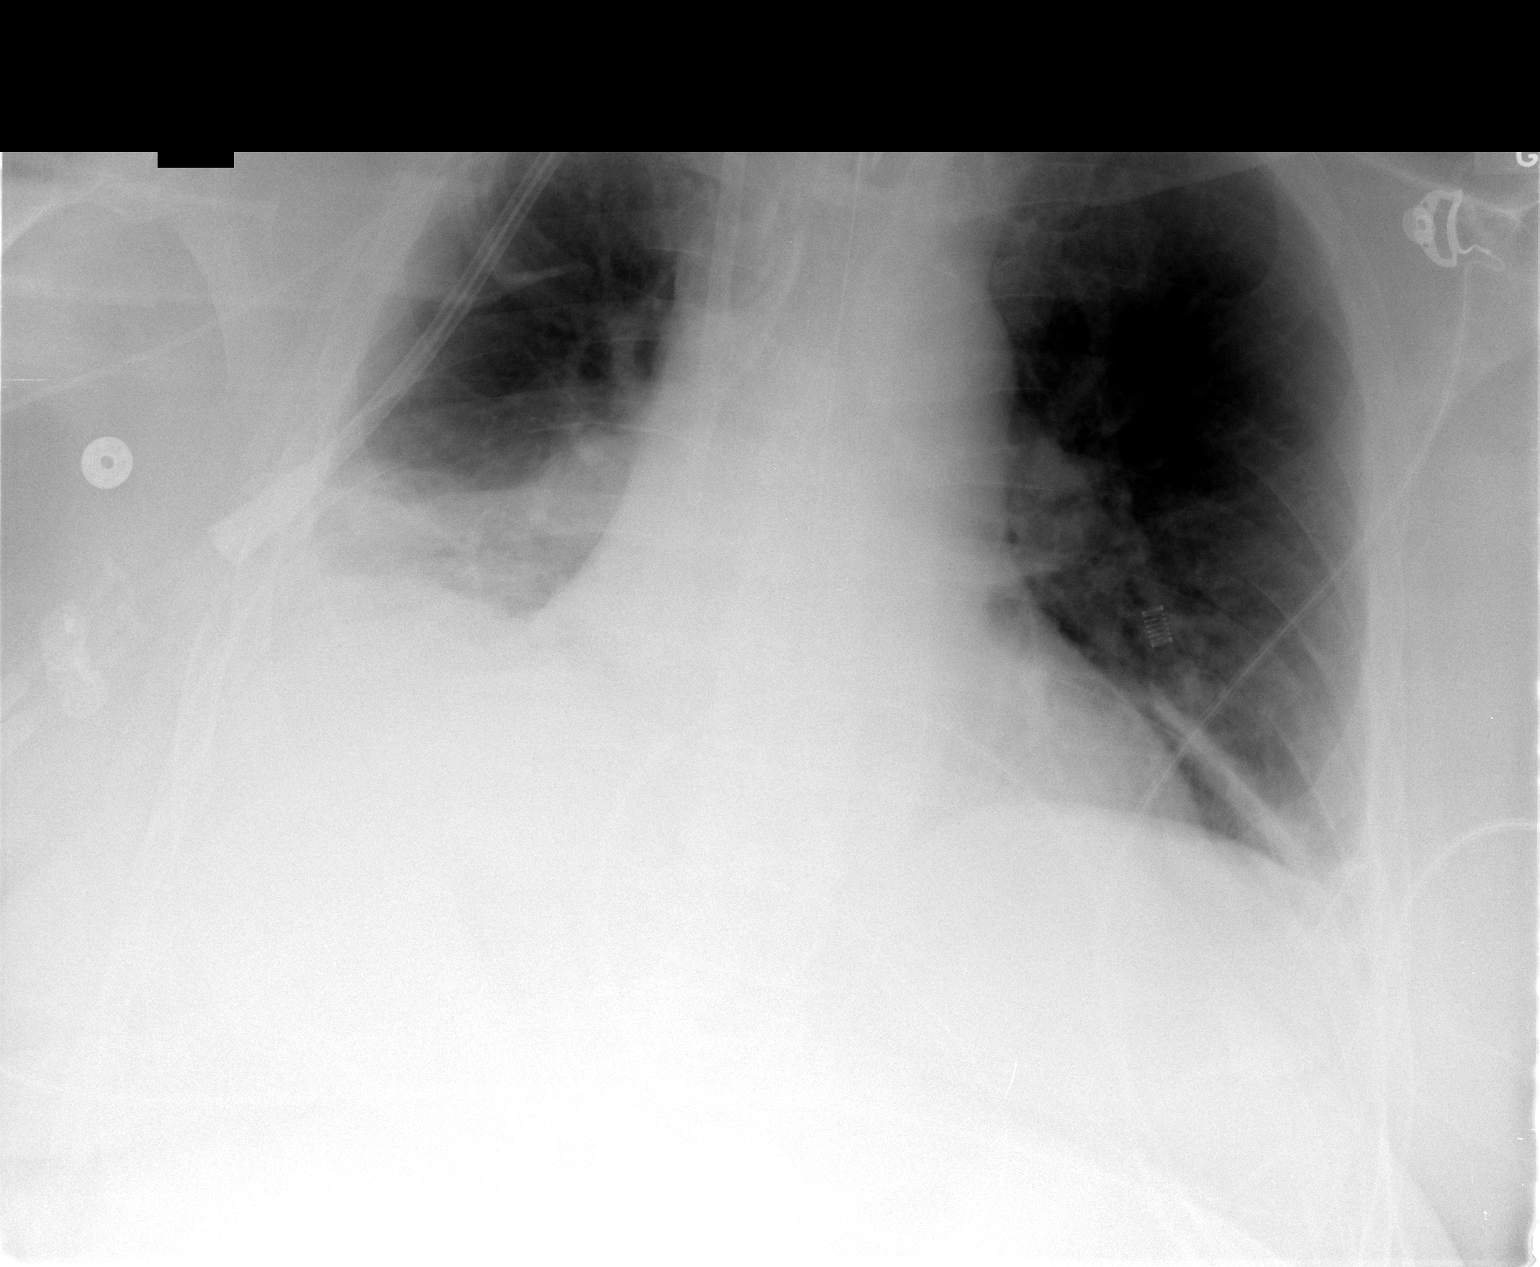

[1 of 1 positions shown; findings below may reference images not displayed]

FINDINGS: Endotracheal tube tip in satisfactory position
approximately 6 cm above the carina.  Right jugular dialysis
catheter tips in the SVC.  Heart enlarged but stable.  Slight
improved aeration in the right lung base, though consolidation
persists.  Improved aeration in the left base, with minimal
residual linear atelectasis.  No new pulmonary parenchymal
abnormalities.
IMPRESSION: Support apparatus satisfactory.  Improved aeration at the right
lung base, with persistent consolidation consistent with pneumonia.
Improved aeration in the left base with mild linear atelectasis
persisting.  No new abnormalities.

## 2010-11-19 IMAGING — CR DG CHEST 1V PORT
1 series · 1 of 1 positions shown · non-contrast
Comparison: 12/08/2008

CLINICAL DATA: Respiratory distress

PORTABLE CHEST - 1 VIEW

[AP]
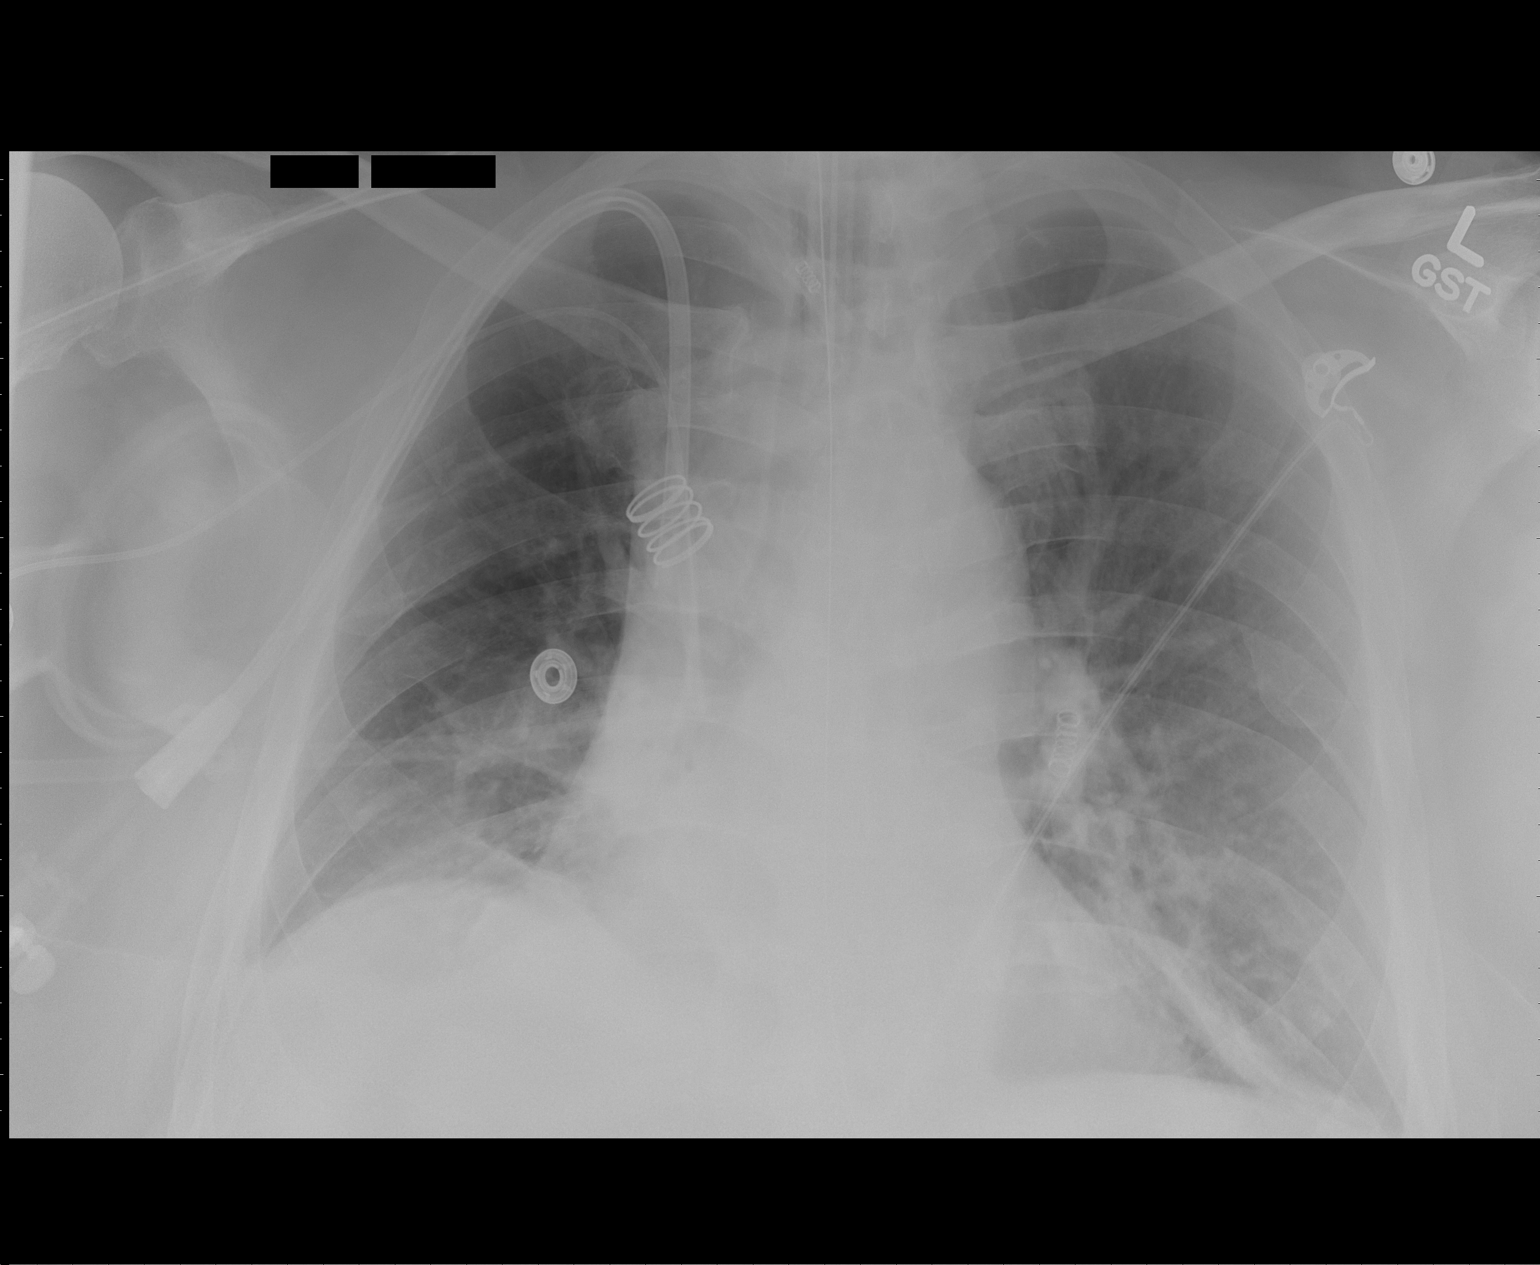

[1 of 1 positions shown; findings below may reference images not displayed]

FINDINGS: Subsegmental atelectasis at the bases.  No airspace
opacities.  ET tube position satisfactory.  NG tube is noted
traversing esophagus and entering the stomach.  PICC line is in the
SVC.  Right jugular central venous catheter is in the SVC as well.
IMPRESSION: Linear atelectasis at the bases.

## 2012-06-04 ENCOUNTER — Ambulatory Visit (INDEPENDENT_AMBULATORY_CARE_PROVIDER_SITE_OTHER): Payer: BC Managed Care – HMO | Admitting: Neurology

## 2012-06-04 ENCOUNTER — Encounter: Payer: Self-pay | Admitting: Neurology

## 2012-06-04 VITALS — BP 143/83 | HR 98 | Ht 72.0 in | Wt 288.0 lb

## 2012-06-04 DIAGNOSIS — Z5181 Encounter for therapeutic drug level monitoring: Secondary | ICD-10-CM

## 2012-06-04 DIAGNOSIS — G7000 Myasthenia gravis without (acute) exacerbation: Secondary | ICD-10-CM | POA: Insufficient documentation

## 2012-06-04 MED ORDER — MYCOPHENOLATE MOFETIL 500 MG PO TABS: ORAL_TABLET | ORAL | Status: DC

## 2012-06-04 NOTE — Patient Instructions (Signed)
Myasthenia Gravis  Myasthenia gravis is a disease that causes muscle weakness throughout the body. The muscles affected are the ones we can control (voluntary muscles). An example of a voluntary muscle is your hand muscles. You can control the muscles to make the hand pick something up. An example of an involuntary muscle is the heart. The heart beats without any direction from you.   Myasthenia Gravis is thought to be an autoimmune disease. That means that normal defenses of the body begin to attack the body. In this case, the immune system begins to attack cells located at the junctions of the muscles and the nerves. Women are affected more often. Women are affected at a younger age than men. Babies born to affected women frequently develop symptoms at an early age.  SYMPTOMS  Initially in the disease, the facial muscles are affected first. After this, a person may develop droopy eyelids. They may have difficulty controlling facial muscles. They may have problems chewing. Swallowing and speaking may become impaired. The weakness gradually spreads to the arms and legs. It begins to affect breathing. Sometimes, the symptoms lessen or go away without any apparent cause.  DIAGNOSIS   Diagnosis can be made with blood tests. Tests such as electromyography may be done to examine the electrical activity in the muscle. An improvement in symptoms after having an anti-cholinesterase drug helps confirm the diagnosis.   TREATMENT   Medicines are usually prescribed as the first treatment. These medicines help, but they do not cure the disease. A plasma cleansing procedure (plasmapheresis) can be used to treat a crisis. It can also be used to prepare a person for surgery. This procedure produces short-term improvement. Some cases are helped by removing the thymus gland. Steroids are used for short-term benefits.  Document Released: 06/03/2000 Document Revised: 05/20/2011 Document Reviewed: 02/25/2005  [employer] Patient  Information 2013 [employer].

## 2012-06-04 NOTE — Progress Notes (Signed)
   Reason for visit: Myasthenia gravis  Randall Phillips is an 57 y.o. male  History of present illness:  [name] is a 57 year old right-handed white male with a history of myasthenia gravis. The patient has in the past had significant issues with speech and swallowing. The patient continues to have weakness with jaw closure, and he will fatigue while eating and while talking. The patient has significant weakness of the right greater left proximal arms. The patient denies any new symptoms regarding his myasthenia gravis. The patient comes in today, and he indicates that he was up all night traveling with work. The patient is fatigued today. The patient is on Mestinon, CellCept, and prednisone. No other new medical issues have come up since last seen.  ROS:  Out of a complete 14 system review of symptoms, the patient complains only of the following symptoms, and all other reviewed systems are negative.  Weakness Difficulty chewing Fatigue  Blood pressure 143/83, pulse 98, height 6' (1.829 m), weight 288 lb (130.636 kg).  Physical Exam  General: The patient is alert and cooperative at the time of the examination. The patient is markedly obese.  Skin: No significant peripheral edema is noted.   Neurologic Exam  Cranial nerves: Facial symmetry is present. Speech is normal, no aphasia or dysarthria is noted. Extraocular movements are full. Visual fields are full. With superior gaze for 1 minute, the patient has no increased ptosis or divergence of gaze. The patient does have a mild facial diplegia.  Motor: The patient has good strength in all 4 extremities, with the exception that there is 4 minus/ 5 strength of the right deltoid, or plus/5 strength of the left deltoid. The patient fatigues rapidly with the arms outstretched on the right, and the patient is unable to support the left arm after 35 seconds..  Coordination: The patient has good finger-nose-finger and heel-to-shin  bilaterally.  Gait and station: The patient has a normal gait. Tandem gait is slightly unsteady. Romberg is negative. No drift is seen.  Reflexes: Deep tendon reflexes are symmetric, but are depressed.   Assessment/Plan:  One. Myasthenia gravis  The patient continues to have some weakness with the facial muscles, and jaw muscles, and with the upper extremities. The patient is still maintaining gainful employment. The patient will be kept on his current medication regimen, and blood work will be done today. The patient will followup in 4 months. He will contact our office if new issues arise.  Randall Palau MD 06/04/2012 9:38 AM  [facility] [address] [place], [place] [address]  Phone [phone] Fax [phone]

## 2012-06-05 LAB — CBC WITH DIFFERENTIAL/PLATELET
Basos: 0 % (ref 0–3)
Eos: 1 % (ref 0–5)
HCT: 44.8 % (ref 37.5–51.0)
Lymphocytes Absolute: 0.7 10*3/uL (ref 0.7–3.1)
MCV: 91 fL (ref 79–97)
Monocytes Absolute: 0.6 10*3/uL (ref 0.1–0.9)
RBC: 4.93 x10E6/uL (ref 4.14–5.80)
RDW: 14.1 % (ref 12.3–15.4)
WBC: 7.7 10*3/uL (ref 3.4–10.8)

## 2012-06-05 LAB — COMPREHENSIVE METABOLIC PANEL
Albumin: 4.1 g/dL (ref 3.5–5.5)
BUN: 11 mg/dL (ref 6–24)
Chloride: 104 mmol/L (ref 97–108)
Creatinine, Ser: 0.93 mg/dL (ref 0.76–1.27)
GFR calc Af Amer: 104 mL/min/{1.73_m2} (ref >59)
Globulin, Total: 2.4 g/dL (ref 1.5–4.5)
Glucose: 97 mg/dL (ref 65–99)
Total Bilirubin: 0.5 mg/dL (ref 0.0–1.2)
Total Protein: 6.5 g/dL (ref 6.0–8.5)

## 2012-06-10 NOTE — Progress Notes (Signed)
Quick Note:  I called and spoke with the patient concerning his lab results being normal. ______ 

## 2012-10-03 ENCOUNTER — Other Ambulatory Visit: Payer: Self-pay | Admitting: Neurology

## 2012-10-20 ENCOUNTER — Other Ambulatory Visit: Payer: Self-pay | Admitting: Neurology

## 2012-10-29 ENCOUNTER — Ambulatory Visit (INDEPENDENT_AMBULATORY_CARE_PROVIDER_SITE_OTHER): Payer: BC Managed Care – HMO | Admitting: Neurology

## 2012-10-29 ENCOUNTER — Encounter: Payer: Self-pay | Admitting: Neurology

## 2012-10-29 ENCOUNTER — Other Ambulatory Visit: Payer: Self-pay | Admitting: Neurology

## 2012-10-29 VITALS — BP 146/83 | HR 102 | Wt 284.0 lb

## 2012-10-29 DIAGNOSIS — G7000 Myasthenia gravis without (acute) exacerbation: Secondary | ICD-10-CM

## 2012-10-29 DIAGNOSIS — Z5181 Encounter for therapeutic drug level monitoring: Secondary | ICD-10-CM

## 2012-10-29 NOTE — Progress Notes (Signed)
Reason for visit: Myasthenia gravis  Randall Phillips is an 57 y.o. male  History of present illness:  Randall Phillips is a 56 year old right-handed white male with a history of myasthenia gravis and obesity and diabetes. The patient has recently had a worsening of his ability to speak and to chew. The patient noted that the generic manufacture of his prednisone has changed, and he is gone back to the pharmacy and got in the usual brand. He just started the usual medication today. In the past, the patient has also noted exacerbations when his Imuran generic manufacturer had changed. The patient currently is on CellCept taking 500 mg, 2 tablets in the morning and 3 tablets in the evening. The patient takes Mestinon, 60 mg one tablet every 3-4 hours. The patient denies any problems with choking, and he denies shortness of breath. The patient has some discomfort and weakness in the right shoulder with abduction of the arm.  Past Medical History  Diagnosis Date  . Myasthenia gravis   . Obesity   . Hx of renal calculi   . History of renal calculi   . 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 2010  . Vision abnormalities     Left eye blind  . Diabetes mellitus without complication     diet controlled    Past Surgical History  Procedure Laterality Date  . Tonsillectomy    . Thymectomy      Status post    Family History  Problem Relation Age of Onset  . Cancer - Lung Mother   . Diabetes Mother   . Cerebrovascular Disease Father   . Stroke Father 81  . Diabetes Father     Social history:  reports that he has never smoked. He does not have any smokeless tobacco history on file. He reports that he does not drink alcohol or use illicit drugs.   No Known Allergies  Medications:  Current Outpatient Prescriptions on File Prior to Visit  Medication Sig Dispense Refill  . Alpha-Lipoic Acid 100 MG CAPS Take 1 capsule by  mouth daily.      . Calcium Carbonate-Vitamin D (CALTRATE 600+D) 600-400 MG-UNIT per tablet Take 1 tablet by mouth daily.      . diphenhydrAMINE (SOMINEX) 25 MG tablet Take 25 mg by mouth at bedtime as needed for sleep.      . fish oil-omega-3 fatty acids 1000 MG capsule Take 2 g by mouth daily.      . Multiple Vitamin (MULTIVITAMIN) tablet Take 1 tablet by mouth daily.      . mycophenolate (CELLCEPT) 500 MG tablet Two tablets in the morning and 3 tablets in the evening      . predniSONE (DELTASONE) 10 MG tablet TAKE 2 TABLETS BY MOUTH EVERY DAY  60 tablet  0  . pyridostigmine (MESTINON) 60 MG tablet TAKE 1 TABLET BY MOUTH EVERY 3 TO 4 HOURS AS NEEDED  240 tablet  3  . vitamin E (VITAMIN E) 400 UNIT capsule Take 400 Units by mouth daily.       No current facility-administered medications on file prior to visit.    ROS:  Out of a complete 14 system review of symptoms, the patient complains only of the following symptoms, and all other reviewed systems are negative.  Difficulty chewing Slurred speech  Blood pressure 146/83, pulse 102, weight 284 lb (128.822 kg).  Physical Exam  General: The patient is alert  and cooperative at the time of the examination. The patient is markedly obese.  Skin: No significant peripheral edema is noted.   Neurologic Exam  Cranial nerves: Facial symmetry is present. Speech is notable for some dysarthria and dysphonia.. Extraocular movements are full. Visual fields are full. Facial diplegia is noted. The patient has significant weakness with jaw closure.  Motor: The patient has good strength in all 4 extremities, with the exception that there is severe weakness of the right deltoid muscle.  Coordination: The patient has good finger-nose-finger and heel-to-shin bilaterally.  Gait and station: The patient has a normal gait. Tandem gait is normal. Romberg is negative. No drift is seen.  Reflexes: Deep tendon reflexes are symmetric, but are  depressed.   Assessment/Plan:  1. Myasthenia gravis  2. Obesity  3. Diabetes  The patient appears to be extremely sensitive to changes in the generic manufactures of his medications. The patient has gone back on his usual prednisone brand, and he should improve with his symptoms over the next several days. If not, he will call me. The patient will have blood work done today. He will continue his Mestinon and CellCept.  Marlan Palau MD 10/29/2012 8:18 AM  [facility] [address] [place], [place] [address]  Phone [phone] Fax [phone]

## 2012-11-04 ENCOUNTER — Telehealth: Payer: Self-pay

## 2012-11-04 LAB — COMPREHENSIVE METABOLIC PANEL
ALT: 16 IU/L (ref 0–44)
Albumin: 4.3 g/dL (ref 3.5–5.5)
BUN: 14 mg/dL (ref 6–24)
CO2: 25 mmol/L (ref 18–29)
Calcium: 9.1 mg/dL (ref 8.7–10.2)
Chloride: 104 mmol/L (ref 97–108)
Glucose: 139 mg/dL — ABNORMAL HIGH (ref 65–99)
Potassium: 4.2 mmol/L (ref 3.5–5.2)
Total Bilirubin: 0.5 mg/dL (ref 0.0–1.2)
Total Protein: 6.5 g/dL (ref 6.0–8.5)

## 2012-11-04 LAB — CBC WITH DIFFERENTIAL
Basophils Absolute: 0 10*3/uL (ref 0.0–0.2)
Eosinophils Absolute: 0 10*3/uL (ref 0.0–0.4)
HCT: 44.6 % (ref 37.5–51.0)
Immature Grans (Abs): 0 10*3/uL (ref 0.0–0.1)
Immature Granulocytes: 0 % (ref 0–2)
Lymphs: 8 % — ABNORMAL LOW (ref 14–46)
MCHC: 34.8 g/dL (ref 31.5–35.7)
Monocytes: 4 % (ref 4–12)
Neutrophils Absolute: 7.9 10*3/uL — ABNORMAL HIGH (ref 1.4–7.0)
Neutrophils Relative %: 88 % — ABNORMAL HIGH (ref 40–74)
Platelets: 233 10*3/uL (ref 150–379)
RDW: 13.8 % (ref 12.3–15.4)
WBC: 9 10*3/uL (ref 3.4–10.8)

## 2012-11-04 NOTE — Telephone Encounter (Signed)
Called and spoke to patient told him normal labs. Patient understood.

## 2012-11-04 NOTE — Telephone Encounter (Signed)
Message copied by Dan Humphreys on Wed Nov 04, 2012  3:25 PM ------      Message from: Stephanie Acre      Created: Wed Nov 04, 2012 12:28 PM       Please call the patient. The blood work results are unremarkable. Thank you.            ----- Message -----         From: [facility] Lab Results In Interface         Sent: 11/04/2012  11:22 AM           To: York Spaniel, MD                   ------

## 2012-11-19 ENCOUNTER — Other Ambulatory Visit: Payer: Self-pay | Admitting: Neurology

## 2013-01-26 ENCOUNTER — Other Ambulatory Visit: Payer: Self-pay | Admitting: Neurology

## 2013-05-18 ENCOUNTER — Encounter: Payer: Self-pay | Admitting: Neurology

## 2013-05-18 ENCOUNTER — Ambulatory Visit (INDEPENDENT_AMBULATORY_CARE_PROVIDER_SITE_OTHER): Payer: BC Managed Care – HMO | Admitting: Neurology

## 2013-05-18 VITALS — BP 145/92 | HR 100 | Wt 306.0 lb

## 2013-05-18 DIAGNOSIS — Z5181 Encounter for therapeutic drug level monitoring: Secondary | ICD-10-CM

## 2013-05-18 DIAGNOSIS — G7000 Myasthenia gravis without (acute) exacerbation: Secondary | ICD-10-CM

## 2013-05-18 LAB — COMPREHENSIVE METABOLIC PANEL
ALK PHOS: 44 IU/L (ref 39–117)
ALT: 16 IU/L (ref 0–44)
AST: 18 IU/L (ref 0–40)
Albumin/Globulin Ratio: 1.7 (ref 1.1–2.5)
Albumin: 4 g/dL (ref 3.5–5.5)
BUN / CREAT RATIO: 10 (ref 9–20)
BUN: 9 mg/dL (ref 6–24)
CHLORIDE: 103 mmol/L (ref 96–108)
CO2: 29 mmol/L (ref 18–29)
CREATININE: 0.89 mg/dL (ref 0.76–1.27)
Calcium: 8.9 mg/dL (ref 8.7–10.2)
GFR calc Af Amer: 108 mL/min/{1.73_m2} (ref >59)
GFR calc non Af Amer: 94 mL/min/{1.73_m2} (ref >59)
GLOBULIN, TOTAL: 2.3 g/dL (ref 1.5–4.5)
Glucose: 158 mg/dL — ABNORMAL HIGH (ref 65–99)
Potassium: 4 mmol/L (ref 3.5–5.2)
SODIUM: 139 mmol/L (ref 134–144)
Total Bilirubin: 0.4 mg/dL (ref 0.0–1.2)
Total Protein: 6.3 g/dL (ref 6.0–8.5)

## 2013-05-18 LAB — CBC WITH DIFFERENTIAL
BASOS ABS: 0 10*3/uL (ref 0.0–0.2)
Basos: 0 %
EOS ABS: 0 10*3/uL (ref 0.0–0.4)
EOS: 0 %
HCT: 44.4 % (ref 37.5–51.0)
Hemoglobin: 15.3 g/dL (ref 12.6–17.7)
Lymphocytes Absolute: 0.6 10*3/uL — ABNORMAL LOW (ref 0.7–3.1)
Lymphs: 7 %
MCH: 31.4 pg (ref 26.6–33.0)
MCHC: 34.5 g/dL (ref 31.5–35.7)
MCV: 91 fL (ref 79–97)
MONOS ABS: 0.5 10*3/uL (ref 0.1–0.9)
Monocytes: 6 %
NEUTROS PCT: 87 %
Neutrophils Absolute: 7.5 10*3/uL — ABNORMAL HIGH (ref 1.4–7.0)
PLATELETS: 216 10*3/uL (ref 150–379)
RBC: 4.88 x10E6/uL (ref 4.14–5.80)
RDW: 13.5 % (ref 12.3–15.4)
WBC: 8.6 10*3/uL (ref 3.4–10.8)

## 2013-05-18 MED ORDER — PREDNISONE 10 MG PO TABS: 20.0000 mg | ORAL_TABLET | Freq: Every day | ORAL | Status: DC

## 2013-05-18 MED ORDER — MYCOPHENOLATE MOFETIL 500 MG PO TABS: ORAL_TABLET | ORAL | Status: DC

## 2013-05-18 MED ORDER — PYRIDOSTIGMINE BROMIDE 60 MG PO TABS: ORAL_TABLET | ORAL | Status: DC

## 2013-05-18 NOTE — Patient Instructions (Signed)
Myasthenia Gravis Myasthenia gravis is a disease that causes muscle weakness throughout the body. The muscles affected are the ones we can control (voluntary muscles). An example of a voluntary muscle is your hand muscles. You can control the muscles to make the hand pick something up. An example of an involuntary muscle is the heart. The heart beats without any direction from you.  Myasthenia Gravis is thought to be an autoimmune disease. That means that normal defenses of the body begin to attack the body. In this case, the immune system begins to attack cells located at the junctions of the muscles and the nerves. Women are affected more often. Women are affected at a younger age than men. Babies born to affected women frequently develop symptoms at an early age. SYMPTOMS Initially in the disease, the facial muscles are affected first. After this, a person may develop droopy eyelids. They may have difficulty controlling facial muscles. They may have problems chewing. Swallowing and speaking may become impaired. The weakness gradually spreads to the arms and legs. It begins to affect breathing. Sometimes, the symptoms lessen or go away without any apparent cause. DIAGNOSIS  Diagnosis can be made with blood tests. Tests such as electromyography may be done to examine the electrical activity in the muscle. An improvement in symptoms after having an anti-cholinesterase drug helps confirm the diagnosis.  TREATMENT  Medicines are usually prescribed as the first treatment. These medicines help, but they do not cure the disease. A plasma cleansing procedure (plasmapheresis) can be used to treat a crisis. It can also be used to prepare a person for surgery. This procedure produces short-term improvement. Some cases are helped by removing the thymus gland. Steroids are used for short-term benefits. Document Released: 06/03/2000 Document Revised: 05/20/2011 Document Reviewed: 02/25/2005 [employer] Patient  Information 2014 [employer].  

## 2013-05-18 NOTE — Progress Notes (Signed)
Reason for visit: Myasthenia gravis  Cristopher EstimableGeorge E Croston is an 58 y.o. male  History of present illness:  Mr. Roderic ScarceShelar is a 58 year old right-handed white male with a history of myasthenia gravis. The patient has chronic weakness of the right deltoid muscle and with chewing. 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 The patient talks on the telephone throughout the day, and he has noticed that his voice seems to hold throughout the day. The patient does occasionally have to assist his jaw with chewing, particularly later in the day. The patient has not had any new issues with the myasthenia gravis. The patient denies problems with leg strength or problems with swallowing or choking. The patient is not having significant issues with double vision. The patient comes to this office for an evaluation.  Past Medical History  Diagnosis Date  . Myasthenia gravis   . Obesity   . Hx of renal calculi   . History of renal calculi   . 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 2010  . Vision abnormalities     Left eye blind  . Diabetes mellitus without complication     diet controlled    Past Surgical History  Procedure Laterality Date  . Tonsillectomy    . Thymectomy      Status post    Family History  Problem Relation Age of Onset  . Cancer - Lung Mother   . Diabetes Mother   . Cerebrovascular Disease Father   . Stroke Father [id number]  . Diabetes Father     Social history:  reports that he has never smoked. He has never used smokeless tobacco. He reports that he does not drink alcohol or use illicit drugs.   No Known Allergies  Medications:  Current Outpatient Prescriptions on File Prior to Visit  Medication Sig Dispense Refill  . Alpha-Lipoic Acid 100 MG CAPS Take 1 capsule by mouth daily.      . Calcium  Carbonate-Vitamin D (CALTRATE 600+D) 600-400 MG-UNIT per tablet Take 1 tablet by mouth daily.      . diphenhydrAMINE (SOMINEX) 25 MG tablet Take 25 mg by mouth at bedtime as needed for sleep.      . fish oil-omega-3 fatty acids 1000 MG capsule Take 2 g by mouth daily.      . Multiple Vitamin (MULTIVITAMIN) tablet Take 1 tablet by mouth daily.      . vitamin E (VITAMIN E) 400 UNIT capsule Take 400 Units by mouth daily.       No current facility-administered medications on file prior to visit.    ROS:  Out of a complete 14 system review of symptoms, the patient complains only of the following symptoms, and all other reviewed systems are negative.  Weight gain Chewing problems Weakness  Blood pressure 145/92, pulse 100, weight 306 lb (138.801 kg).  Physical Exam  General: The patient is alert and cooperative at the time of the examination. The patient is markedly obese.  Skin: No significant peripheral edema is noted.   Neurologic Exam  Mental status: The patient is oriented x 3.  Cranial nerves: Facial symmetry is present. Speech is normal, no aphasia or dysarthria is noted. Extraocular movements are full. Visual fields are full. Weakness with jaw closure is noted. The patient has good strength with shoulder shrug bilaterally.  Motor: The patient has good strength in all 4 extremities, with the exception that there is 4/5 strength of the right deltoid muscle.  Sensory examination: Soft touch sensation is symmetric on the face, arms, and legs.  Coordination: The patient has good finger-nose-finger and heel-to-shin bilaterally.  Gait and station: The patient has a normal gait. Tandem gait is slightly unsteady. Romberg is negative. No drift is seen.  Reflexes: Deep tendon reflexes are symmetric, but are depressed.   Assessment/Plan:  1. Myasthenia gravis  The patient is stable with his myasthenia gravis, but he still has some issues with jaw weakness with chewing. The  patient is doing better with his vocal strength. The patient will continue his current medications, and he will followup in about 6 months. Blood work will be done today. Prescriptions were given for the prednisone, Mestinon, and CellCept.  Marlan Palau MD 05/18/2013 9:02 AM  [facility] [address] [place], [place] [address]  Phone [phone] Fax [phone]

## 2013-11-18 ENCOUNTER — Ambulatory Visit (INDEPENDENT_AMBULATORY_CARE_PROVIDER_SITE_OTHER): Payer: BC Managed Care – HMO | Admitting: Neurology

## 2013-11-18 ENCOUNTER — Encounter: Payer: Self-pay | Admitting: Neurology

## 2013-11-18 VITALS — BP 140/85 | HR 102 | Ht 72.5 in | Wt 298.0 lb

## 2013-11-18 DIAGNOSIS — Z5181 Encounter for therapeutic drug level monitoring: Secondary | ICD-10-CM

## 2013-11-18 DIAGNOSIS — G7000 Myasthenia gravis without (acute) exacerbation: Secondary | ICD-10-CM

## 2013-11-18 LAB — COMPREHENSIVE METABOLIC PANEL
ALT: 13 IU/L (ref 0–44)
AST: 14 IU/L (ref 0–40)
Albumin/Globulin Ratio: 1.7 (ref 1.1–2.5)
Albumin: 4 g/dL (ref 3.6–4.8)
Alkaline Phosphatase: 38 IU/L — ABNORMAL LOW (ref 39–117)
BILIRUBIN TOTAL: 0.5 mg/dL (ref 0.0–1.2)
BUN/Creatinine Ratio: 14 (ref 10–22)
BUN: 13 mg/dL (ref 8–27)
CALCIUM: 9 mg/dL (ref 8.6–10.2)
CHLORIDE: 103 mmol/L (ref 96–108)
CO2: 27 mmol/L (ref 18–29)
Creatinine, Ser: 0.9 mg/dL (ref 0.76–1.27)
GFR calc non Af Amer: 93 mL/min/{1.73_m2} (ref >59)
GFR, EST AFRICAN AMERICAN: 107 mL/min/{1.73_m2} (ref >59)
GLUCOSE: 146 mg/dL — AB (ref 65–99)
Globulin, Total: 2.3 g/dL (ref 1.5–4.5)
POTASSIUM: 4.3 mmol/L (ref 3.5–5.2)
Sodium: 137 mmol/L (ref 134–144)
Total Protein: 6.3 g/dL (ref 6.0–8.5)

## 2013-11-18 LAB — CBC WITH DIFFERENTIAL
BASOS ABS: 0 10*3/uL (ref 0.0–0.2)
Basos: 0 %
Eos: 0 %
Eosinophils Absolute: 0 10*3/uL (ref 0.0–0.4)
HCT: 42.5 % (ref 37.5–51.0)
Hemoglobin: 14.7 g/dL (ref 12.6–17.7)
LYMPHS: 6 %
Lymphocytes Absolute: 0.7 10*3/uL (ref 0.7–3.1)
MCH: 31.3 pg (ref 26.6–33.0)
MCHC: 34.6 g/dL (ref 31.5–35.7)
MCV: 91 fL (ref 79–97)
MONOCYTES: 3 %
Monocytes Absolute: 0.4 10*3/uL (ref 0.1–0.9)
NEUTROS ABS: 9.5 10*3/uL — AB (ref 1.4–7.0)
NEUTROS PCT: 91 %
Platelets: 221 10*3/uL (ref 150–379)
RBC: 4.69 x10E6/uL (ref 4.14–5.80)
RDW: 13.7 % (ref 12.3–15.4)
WBC: 10.5 10*3/uL (ref 3.4–10.8)

## 2013-11-18 MED ORDER — PREDNISONE 10 MG PO TABS: 20.0000 mg | ORAL_TABLET | Freq: Every day | ORAL | Status: DC

## 2013-11-18 MED ORDER — MYCOPHENOLATE MOFETIL 500 MG PO TABS: ORAL_TABLET | ORAL | Status: DC

## 2013-11-18 MED ORDER — PYRIDOSTIGMINE BROMIDE 60 MG PO TABS: ORAL_TABLET | ORAL | Status: DC

## 2013-11-18 NOTE — Patient Instructions (Signed)
Myasthenia Gravis Myasthenia gravis is a disease that causes muscle weakness throughout the body. The muscles affected are the ones we can control (voluntary muscles). An example of a voluntary muscle is your hand muscles. You can control the muscles to make the hand pick something up. An example of an involuntary muscle is the heart. The heart beats without any direction from you.  Myasthenia Gravis is thought to be an autoimmune disease. That means that normal defenses of the body begin to attack the body. In this case, the immune system begins to attack cells located at the junctions of the muscles and the nerves. Women are affected more often. Women are affected at a younger age than men. Babies born to affected women frequently develop symptoms at an early age. SYMPTOMS Initially in the disease, the facial muscles are affected first. After this, a person may develop droopy eyelids. They may have difficulty controlling facial muscles. They may have problems chewing. Swallowing and speaking may become impaired. The weakness gradually spreads to the arms and legs. It begins to affect breathing. Sometimes, the symptoms lessen or go away without any apparent cause. DIAGNOSIS  Diagnosis can be made with blood tests. Tests such as electromyography may be done to examine the electrical activity in the muscle. An improvement in symptoms after having an anti-cholinesterase drug helps confirm the diagnosis.  TREATMENT  Medicines are usually prescribed as the first treatment. These medicines help, but they do not cure the disease. A plasma cleansing procedure (plasmapheresis) can be used to treat a crisis. It can also be used to prepare a person for surgery. This procedure produces short-term improvement. Some cases are helped by removing the thymus gland. Steroids are used for short-term benefits. Document Released: 06/03/2000 Document Revised: 05/20/2011 Document Reviewed: 04/28/2013 [employer] Patient  Information 2015 [employer]. This information is not intended to replace advice given to you by your health care provider. Make sure you discuss any questions you have with your health care provider.  

## 2013-11-18 NOTE — Progress Notes (Signed)
Reason for visit: Myasthenia gravis  Randall Phillips is an 58 y.o. male  History of present illness:  Randall Phillips is a 58 year old right-handed white male with a history of myasthenia gravis. The patient has associated weakness of the facial muscles, and with jaw closure. He has had persistent weakness of the right deltoid muscle. The patient denies any problems with swallowing, and he believes that his disease process has been very stable since last seen. The patient is on prednisone, Mestinon, and CellCept. The patient denies any new medical issues that have come up since last seen. He indicates that with eating, he oftentimes has to assist his jaw with chewing, but he never has problems with swallowing. He denies issues with breathing. He admits to not eating well, eating too many carbohydrates. If he can reduce the carbohydrate intake, the does better with the myasthenia gravis.  Past Medical History  Diagnosis Date  . Myasthenia gravis   . Obesity   . Hx of renal calculi   . History of renal calculi   . 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 2010  . Vision abnormalities     Left eye blind  . Diabetes mellitus without complication     diet controlled    Past Surgical History  Procedure Laterality Date  . Tonsillectomy    . Thymectomy      Status post    Family History  Problem Relation Age of Onset  . Cancer - Lung Mother   . Diabetes Mother   . Cerebrovascular Disease Father   . Stroke Father 29  . Diabetes Father     Social history:  reports that he has never smoked. He has never used smokeless tobacco. He reports that he does not drink alcohol or use illicit drugs.   No Known Allergies  Medications:  Current Outpatient Prescriptions on File Prior to Visit  Medication Sig Dispense Refill  . Calcium Carbonate-Vitamin D (CALTRATE 600+D) 600-400 MG-UNIT per tablet Take 1 tablet by mouth  daily.      . diphenhydrAMINE (SOMINEX) 25 MG tablet Take 25 mg by mouth at bedtime as needed for sleep.      . fish oil-omega-3 fatty acids 1000 MG capsule Take 2 g by mouth daily.      . Multiple Vitamin (MULTIVITAMIN) tablet Take 1 tablet by mouth daily.      . vitamin E (VITAMIN E) 400 UNIT capsule Take 400 Units by mouth daily.       No current facility-administered medications on file prior to visit.    ROS:  Out of a complete 14 system review of symptoms, the patient complains only of the following symptoms, and all other reviewed systems are negative.  Weakness Chewing problems  Blood pressure 140/85, pulse 102, height 6' 0.5" (1.842 m), weight 298 lb (135.172 kg).  Physical Exam  General: The patient is alert and cooperative at the time of the examination. The patient is markedly obese.  Skin: No significant peripheral edema is noted.   Neurologic Exam  Mental status: The patient is oriented x 3.  Cranial nerves: Facial symmetry is present. Speech is normal, no aphasia or dysarthria is noted. Extraocular movements are full, with exception that there is medial deviation of the left eye in all fields of gaze. Visual fields are full. With superior gaze for 1 minute, there is no alteration in eye movements, no ptosis. Facial diplegia is seen,  some weakness with jaw closure is noted.  Motor: The patient has good strength in all 4 extremities, with exception of 4 minus/5 strength of the right deltoid. With superior gaze for 1 minute, there is fatigable weakness of the right deltoid, right arm proximal to the side, and mild fatigue or weakness of the left deltoid.  Sensory examination: Soft touch sensation is symmetric on the face, arms, and legs.  Coordination: The patient has good finger-nose-finger and heel-to-shin bilaterally.  Gait and station: The patient has a normal gait. Tandem gait is normal. Romberg is negative. No drift is seen.  Reflexes: Deep tendon reflexes are  symmetric, but are depressed.   Assessment/Plan:  1. Myasthenia gravis  The patient continues to have facial diplegia, weakness with jaw closure, and right deltoid weakness, but this is a stable feature, unchanged since last seen. The patient will continue on his current medications. He was encouraged to reduce the carbohydrate intake, lose weight. The patient will have blood work drawn today, and his medications were refilled. He will followup in 6 months.  Marlan Palau MD 11/18/2013 8:21 AM  [facility] [address] [place], [place] [address]  Phone [phone] Fax [phone]

## 2013-11-19 ENCOUNTER — Telehealth: Payer: Self-pay | Admitting: *Deleted

## 2013-11-19 NOTE — Telephone Encounter (Signed)
Blood work was unremarkable with exception that the glucose level is slightly high, this is a random blood sugar. Please call the patient.

## 2014-01-26 ENCOUNTER — Encounter: Payer: Self-pay | Admitting: Neurology

## 2014-02-01 ENCOUNTER — Encounter: Payer: Self-pay | Admitting: Neurology

## 2014-02-17 ENCOUNTER — Telehealth: Payer: Self-pay | Admitting: Neurology

## 2014-02-17 ENCOUNTER — Other Ambulatory Visit (INDEPENDENT_AMBULATORY_CARE_PROVIDER_SITE_OTHER): Payer: Self-pay

## 2014-02-17 DIAGNOSIS — Z5181 Encounter for therapeutic drug level monitoring: Secondary | ICD-10-CM

## 2014-02-17 DIAGNOSIS — Z0289 Encounter for other administrative examinations: Secondary | ICD-10-CM

## 2014-02-17 NOTE — Telephone Encounter (Signed)
I called the patient, he is to come in for a CBC and comprehensive metabolic profile blood test.

## 2014-02-18 LAB — CBC WITH DIFFERENTIAL
BASOS: 0 %
Basophils Absolute: 0 10*3/uL (ref 0.0–0.2)
EOS ABS: 0 10*3/uL (ref 0.0–0.4)
Eos: 1 %
HCT: 41.6 % (ref 37.5–51.0)
Hemoglobin: 14.1 g/dL (ref 12.6–17.7)
Immature Grans (Abs): 0 10*3/uL (ref 0.0–0.1)
Immature Granulocytes: 0 %
Lymphocytes Absolute: 1.2 10*3/uL (ref 0.7–3.1)
Lymphs: 15 %
MCH: 31.4 pg (ref 26.6–33.0)
MCHC: 33.9 g/dL (ref 31.5–35.7)
MCV: 93 fL (ref 79–97)
MONOS ABS: 0.7 10*3/uL (ref 0.1–0.9)
Monocytes: 9 %
NEUTROS ABS: 5.8 10*3/uL (ref 1.4–7.0)
Neutrophils Relative %: 75 %
Platelets: 222 10*3/uL (ref 150–379)
RBC: 4.49 x10E6/uL (ref 4.14–5.80)
RDW: 14.4 % (ref 12.3–15.4)
WBC: 7.7 10*3/uL (ref 3.4–10.8)

## 2014-02-18 LAB — COMPREHENSIVE METABOLIC PANEL
A/G RATIO: 1.9 (ref 1.1–2.5)
ALBUMIN: 3.9 g/dL (ref 3.6–4.8)
ALT: 15 IU/L (ref 0–44)
AST: 17 IU/L (ref 0–40)
Alkaline Phosphatase: 39 IU/L (ref 39–117)
BUN/Creatinine Ratio: 15 (ref 10–22)
BUN: 15 mg/dL (ref 8–27)
CALCIUM: 8.9 mg/dL (ref 8.6–10.2)
CO2: 21 mmol/L (ref 18–29)
CREATININE: 1.03 mg/dL (ref 0.76–1.27)
Chloride: 107 mmol/L (ref 97–108)
GFR calc Af Amer: 91 mL/min/{1.73_m2} (ref >59)
GFR, EST NON AFRICAN AMERICAN: 79 mL/min/{1.73_m2} (ref >59)
GLOBULIN, TOTAL: 2.1 g/dL (ref 1.5–4.5)
Glucose: 183 mg/dL — ABNORMAL HIGH (ref 65–99)
Potassium: 4 mmol/L (ref 3.5–5.2)
Sodium: 140 mmol/L (ref 134–144)
TOTAL PROTEIN: 6 g/dL (ref 6.0–8.5)
Total Bilirubin: 0.3 mg/dL (ref 0.0–1.2)

## 2014-05-19 ENCOUNTER — Encounter: Payer: Self-pay | Admitting: Neurology

## 2014-05-19 ENCOUNTER — Ambulatory Visit (INDEPENDENT_AMBULATORY_CARE_PROVIDER_SITE_OTHER): Payer: BC Managed Care – PPO | Admitting: Neurology

## 2014-05-19 VITALS — BP 134/85 | HR 87 | Ht 72.0 in | Wt 304.6 lb

## 2014-05-19 DIAGNOSIS — Z5181 Encounter for therapeutic drug level monitoring: Secondary | ICD-10-CM | POA: Diagnosis not present

## 2014-05-19 DIAGNOSIS — G7000 Myasthenia gravis without (acute) exacerbation: Secondary | ICD-10-CM | POA: Diagnosis not present

## 2014-05-19 MED ORDER — MYCOPHENOLATE MOFETIL 500 MG PO TABS: ORAL_TABLET | ORAL | Status: DC

## 2014-05-19 MED ORDER — PREDNISONE 10 MG PO TABS: 20.0000 mg | ORAL_TABLET | Freq: Every day | ORAL | Status: DC

## 2014-05-19 NOTE — Progress Notes (Signed)
Reason for visit: Myasthenia gravis  Randall Phillips is an 59 y.o. male  History of present illness:  Randall Phillips is a 59 year old right-handed white male with a history of myasthenia gravis associated with weakness of the facial muscles and the muscles of mastication. The patient also has chronic weakness of the right deltoid muscle. He is on prednisone, Mestinon, and CellCept. The patient indicates that he has been able to cut back on the Mestinon usage. He has noted that high carbohydrate meals make his weakness worse. The patient has difficulty however, modifying his diet. He has significant issues with diabetes, and his blood sugars have been poorly controlled. This issue does play into his myasthenia gravis symptoms. The patient does not report any double vision. He does have some ongoing issues with chewing and swallowing, but no choking. He continues to have right deltoid muscle weakness. The patient returns for an evaluation.  Past Medical History  Diagnosis Date  . Myasthenia gravis   . Obesity   . Hx of renal calculi   . History of renal calculi   . 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 2010  . Vision abnormalities     Left eye blind  . Diabetes mellitus without complication     diet controlled    Past Surgical History  Procedure Laterality Date  . Tonsillectomy    . Thymectomy      Status post    Family History  Problem Relation Age of Onset  . Cancer - Lung Mother   . Diabetes Mother   . Cerebrovascular Disease Father   . Stroke Father 3  . Diabetes Father     Social history:  reports that he has never smoked. He has never used smokeless tobacco. He reports that he does not drink alcohol or use illicit drugs.   No Known Allergies  Medications:  Prior to Admission medications   Medication Sig Start Date End Date Taking? Authorizing Provider  Calcium Carbonate-Vitamin D (CALTRATE  600+D) 600-400 MG-UNIT per tablet Take 1 tablet by mouth daily.   Yes Historical Provider, MD  COCONUT OIL PO Take by mouth daily.   Yes Historical Provider, MD  fish oil-omega-3 fatty acids 1000 MG capsule Take 2 g by mouth daily.   Yes Historical Provider, MD  mycophenolate (CELLCEPT) 500 MG tablet Two tablets in the morning and three tablets in the evening 11/18/13  Yes Randall Spaniel, MD  predniSONE (DELTASONE) 10 MG tablet Take 2 tablets (20 mg total) by mouth daily with breakfast. 11/18/13  Yes Randall Spaniel, MD  pyridostigmine (MESTINON) 60 MG tablet One tablet every 3 to 4 hours as needed 11/18/13  Yes Randall Spaniel, MD    ROS:  Out of a complete 14 system review of symptoms, the patient complains only of the following symptoms, and all other reviewed systems are negative.  Weakness  Blood pressure 134/85, pulse 87, height 6' (1.829 m), weight 304 lb 9.6 oz (138.166 kg).  Physical Exam  General: The patient is alert and cooperative at the time of the examination. The patient is markedly obese.  Skin: No significant peripheral edema is noted.   Neurologic Exam  Mental status: The patient is alert and oriented x 3 at the time of the examination. The patient has apparent normal recent and remote memory, with an apparently normal attention span and concentration ability.   Cranial nerves: Facial symmetry is present.  Speech is normal, no aphasia or dysarthria is noted. Extraocular movements are full. Visual fields are full. With superior gaze for 1 minute, no divergence of gaze is seen, no subjective double vision is noted, no ptosis is seen.  Motor: The patient has good strength in all 4 extremities, with exception of 4/5 strength in the right deltoid muscle. The patient has facial diplegia, no weakness with neck flexion, extension, or rotation. With the arms abducted for 1 minute, the patient is unable to maintain the elevation of the right arm at 30 seconds. The patient has  minimal fatigable weakness of the left deltoid muscle.  Sensory examination: Soft touch sensation is symmetric on the face, arms, and legs.  Coordination: The patient has good finger-nose-finger and heel-to-shin bilaterally.  Gait and station: The patient has a normal gait. Tandem gait is normal. Romberg is negative. No drift is seen.  Reflexes: Deep tendon reflexes are symmetric.   Assessment/Plan:  1. Myasthenia gravis  2. Diabetes  The diabetes remains poorly controlled, and this has had a negative impact on his myasthenia. The patient will try to improve is dietary intake by reducing carbohydrates. He will continue his current medications for the myasthenia. Blood work will be done today, he will follow-up in 6 months.  Randall Phillips. Randall Zianna Dercole MD 05/19/2014 11:43 AM  [facility] [address] [place], [place] [id number]  Phone [phone] Fax [phone]

## 2014-05-20 LAB — COMPREHENSIVE METABOLIC PANEL
ALK PHOS: 43 IU/L (ref 39–117)
ALT: 10 IU/L (ref 0–44)
AST: 14 IU/L (ref 0–40)
Albumin/Globulin Ratio: 1.9 (ref 1.1–2.5)
Albumin: 4.1 g/dL (ref 3.6–4.8)
BILIRUBIN TOTAL: 0.5 mg/dL (ref 0.0–1.2)
BUN/Creatinine Ratio: 11 (ref 10–22)
BUN: 10 mg/dL (ref 8–27)
CHLORIDE: 101 mmol/L (ref 97–108)
CO2: 22 mmol/L (ref 18–29)
Calcium: 8.8 mg/dL (ref 8.6–10.2)
Creatinine, Ser: 0.89 mg/dL (ref 0.76–1.27)
GFR calc Af Amer: 107 mL/min/{1.73_m2} (ref >59)
GFR calc non Af Amer: 93 mL/min/{1.73_m2} (ref >59)
Globulin, Total: 2.2 g/dL (ref 1.5–4.5)
Glucose: 150 mg/dL — ABNORMAL HIGH (ref 65–99)
Potassium: 4.3 mmol/L (ref 3.5–5.2)
SODIUM: 140 mmol/L (ref 134–144)
Total Protein: 6.3 g/dL (ref 6.0–8.5)

## 2014-05-20 LAB — CBC WITH DIFFERENTIAL/PLATELET
BASOS ABS: 0 10*3/uL (ref 0.0–0.2)
Basos: 0 %
Eos: 0 %
Eosinophils Absolute: 0 10*3/uL (ref 0.0–0.4)
HEMATOCRIT: 45.3 % (ref 37.5–51.0)
HEMOGLOBIN: 15.1 g/dL (ref 12.6–17.7)
IMMATURE GRANS (ABS): 0 10*3/uL (ref 0.0–0.1)
Immature Granulocytes: 0 %
Lymphocytes Absolute: 0.7 10*3/uL (ref 0.7–3.1)
Lymphs: 8 %
MCH: 30.8 pg (ref 26.6–33.0)
MCHC: 33.3 g/dL (ref 31.5–35.7)
MCV: 92 fL (ref 79–97)
MONOCYTES: 4 %
Monocytes Absolute: 0.3 10*3/uL (ref 0.1–0.9)
NEUTROS PCT: 88 %
Neutrophils Absolute: 8 10*3/uL — ABNORMAL HIGH (ref 1.4–7.0)
Platelets: 223 10*3/uL (ref 150–379)
RBC: 4.91 x10E6/uL (ref 4.14–5.80)
RDW: 14.7 % (ref 12.3–15.4)
WBC: 9.2 10*3/uL (ref 3.4–10.8)

## 2014-05-26 ENCOUNTER — Other Ambulatory Visit: Payer: Self-pay | Admitting: Neurology

## 2014-11-07 ENCOUNTER — Other Ambulatory Visit: Payer: Self-pay | Admitting: Neurology

## 2014-11-21 ENCOUNTER — Encounter: Payer: Self-pay | Admitting: Neurology

## 2014-11-21 ENCOUNTER — Ambulatory Visit (INDEPENDENT_AMBULATORY_CARE_PROVIDER_SITE_OTHER): Payer: BC Managed Care – PPO | Admitting: Neurology

## 2014-11-21 VITALS — BP 141/89 | HR 109 | Ht 72.0 in | Wt 314.5 lb

## 2014-11-21 DIAGNOSIS — G7000 Myasthenia gravis without (acute) exacerbation: Secondary | ICD-10-CM | POA: Diagnosis not present

## 2014-11-21 DIAGNOSIS — Z5181 Encounter for therapeutic drug level monitoring: Secondary | ICD-10-CM | POA: Diagnosis not present

## 2014-11-21 NOTE — Patient Instructions (Signed)
Myasthenia Gravis Myasthenia gravis is a disease that causes muscle weakness throughout the body. The muscles affected are the ones we can control (voluntary muscles). An example of a voluntary muscle is your hand muscles. You can control the muscles to make the hand pick something up. An example of an involuntary muscle is the heart. The heart beats without any direction from you.  Myasthenia Gravis is thought to be an autoimmune disease. That means that normal defenses of the body begin to attack the body. In this case, the immune system begins to attack cells located at the junctions of the muscles and the nerves. Women are affected more often. Women are affected at a younger age than men. Babies born to affected women frequently develop symptoms at an early age. SYMPTOMS Initially in the disease, the facial muscles are affected first. After this, a person may develop droopy eyelids. They may have difficulty controlling facial muscles. They may have problems chewing. Swallowing and speaking may become impaired. The weakness gradually spreads to the arms and legs. It begins to affect breathing. Sometimes, the symptoms lessen or go away without any apparent cause. DIAGNOSIS  Diagnosis can be made with blood tests. Tests such as electromyography may be done to examine the electrical activity in the muscle. An improvement in symptoms after having an anti-cholinesterase drug helps confirm the diagnosis.  TREATMENT  Medicines are usually prescribed as the first treatment. These medicines help, but they do not cure the disease. A plasma cleansing procedure (plasmapheresis) can be used to treat a crisis. It can also be used to prepare a person for surgery. This procedure produces short-term improvement. Some cases are helped by removing the thymus gland. Steroids are used for short-term benefits. Document Released: 06/03/2000 Document Revised: 05/20/2011 Document Reviewed: 04/28/2013 [employer] Patient  Information 2015 [employer]. This information is not intended to replace advice given to you by your health care provider. Make sure you discuss any questions you have with your health care provider.  

## 2014-11-21 NOTE — Progress Notes (Signed)
Reason for visit: Myasthenia gravis  Randall Phillips is an 59 y.o. male  History of present illness:  Randall Phillips is a 59 year old right-handed white male with a history of myasthenia gravis. The patient is stable from his last visit. He continues to have some fatigue with chewing, he has to assist his jaw with eating beginning around the mid-meal. The patient denies any choking. He continues to have some weakness of the proximal muscles of the arms, right greater than left. He does have some right shoulder discomfort at times. He denies any progression of his disease. He is not staying active, he is not monitoring his diabetes. He remains on prednisone 20 mg day, he is on CellCept and he is on Mestinon. He is tolerating these medications well. He returns to this office for an evaluation. He continues to work full-time.  Past Medical History  Diagnosis Date  . Myasthenia gravis   . Obesity   . Hx of renal calculi   . History of renal calculi   . 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 2010  . Vision abnormalities     Left eye blind  . Diabetes mellitus without complication     diet controlled    Past Surgical History  Procedure Laterality Date  . Tonsillectomy    . Thymectomy      Status post    Family History  Problem Relation Age of Onset  . Cancer - Lung Mother   . Diabetes Mother   . Cerebrovascular Disease Father   . Stroke Father 7  . Diabetes Father     Social history:  reports that he has never smoked. He has never used smokeless tobacco. He reports that he does not drink alcohol or use illicit drugs.    Allergies  Allergen Reactions  . Other     All seafood    Medications:  Prior to Admission medications   Medication Sig Start Date End Date Taking? Authorizing Provider  Calcium Carbonate-Vitamin D (CALTRATE 600+D) 600-400 MG-UNIT per tablet Take 1 tablet by mouth daily.   Yes  Historical Provider, MD  COCONUT OIL PO Take by mouth daily.   Yes Historical Provider, MD  fish oil-omega-3 fatty acids 1000 MG capsule Take 2 g by mouth daily.   Yes Historical Provider, MD  mycophenolate (CELLCEPT) 500 MG tablet TAKE 2 TABLETS BY MOUTH IN THE MORNING AND TAKE 3 TABLETS IN THE EVENING 11/07/14  Yes York Spaniel, MD  predniSONE (DELTASONE) 10 MG tablet Take 2 tablets (20 mg total) by mouth daily with breakfast. 05/19/14  Yes York Spaniel, MD  pyridostigmine (MESTINON) 60 MG tablet TAKE 1 TABLET BY MOUTH EVERY 3 TO 4 HOURS AS NEEDED 05/26/14  Yes York Spaniel, MD    ROS:  Out of a complete 14 system review of symptoms, the patient complains only of the following symptoms, and all other reviewed systems are negative.  Weakness  Blood pressure 141/89, pulse 109, height 6' (1.829 m), weight 314 lb 8 oz (142.656 kg).  Physical Exam  General: The patient is alert and cooperative at the time of the examination. The patient is markedly obese.  Skin: 1+ edema at the ankles is noted bilaterally.   Neurologic Exam  Mental status: The patient is alert and oriented x 3 at the time of the examination. The patient has apparent normal recent and remote memory, with an apparently normal attention  span and concentration ability.   Cranial nerves: Facial symmetry is present. Speech is normal, no aphasia or dysarthria is noted. Extraocular movements are full, with exception that the left eye is divergent, medially deviated. Visual fields are full. With superior gaze for 1 minute, no increased ptosis is noted.  Motor: The patient has good strength in all 4 extremities, with exception there is 4/5 strength of the right deltoid muscle. With the arms abducted for 1 minute, the right arm drifts down at around 20 seconds, the patient is able to hold the left arm up, but there is fatigable weakness after 1 minute with the left deltoid  Sensory examination: Soft touch sensation is  symmetric on the face, arms, and legs.  Coordination: The patient has good finger-nose-finger and heel-to-shin bilaterally.  Gait and station: The patient has a normal gait. Tandem gait is normal. Romberg is negative. No drift is seen.  Reflexes: Deep tendon reflexes are symmetric.   Assessment/Plan:  1. Myasthenia gravis  The patient appears to be relatively stable with his current disease process. He will get blood work done today, he will follow-up in 6 months, and have blood checked in 3 months. He will contact our office if any new issues arise.  Marlan Palau MD 11/21/2014 7:56 PM  [facility] [address] [place], [place] [address]  Phone [phone] Fax [phone]

## 2014-11-22 ENCOUNTER — Telehealth: Payer: Self-pay

## 2014-11-22 LAB — COMPREHENSIVE METABOLIC PANEL
A/G RATIO: 2 (ref 1.1–2.5)
ALT: 17 IU/L (ref 0–44)
AST: 19 IU/L (ref 0–40)
Albumin: 4.3 g/dL (ref 3.6–4.8)
Alkaline Phosphatase: 44 IU/L (ref 39–117)
BUN/Creatinine Ratio: 9 — ABNORMAL LOW (ref 10–22)
BUN: 10 mg/dL (ref 8–27)
Bilirubin Total: 0.4 mg/dL (ref 0.0–1.2)
CALCIUM: 8.9 mg/dL (ref 8.6–10.2)
CO2: 22 mmol/L (ref 18–29)
Chloride: 103 mmol/L (ref 97–108)
Creatinine, Ser: 1.07 mg/dL (ref 0.76–1.27)
GFR, EST AFRICAN AMERICAN: 86 mL/min/{1.73_m2} (ref >59)
GFR, EST NON AFRICAN AMERICAN: 75 mL/min/{1.73_m2} (ref >59)
GLUCOSE: 216 mg/dL — AB (ref 65–99)
Globulin, Total: 2.2 g/dL (ref 1.5–4.5)
Potassium: 4.2 mmol/L (ref 3.5–5.2)
Sodium: 143 mmol/L (ref 134–144)
TOTAL PROTEIN: 6.5 g/dL (ref 6.0–8.5)

## 2014-11-22 LAB — CBC WITH DIFFERENTIAL/PLATELET
BASOS ABS: 0 10*3/uL (ref 0.0–0.2)
BASOS: 0 %
EOS (ABSOLUTE): 0 10*3/uL (ref 0.0–0.4)
Eos: 0 %
Hematocrit: 45.9 % (ref 37.5–51.0)
Hemoglobin: 15.1 g/dL (ref 12.6–17.7)
IMMATURE GRANS (ABS): 0 10*3/uL (ref 0.0–0.1)
Immature Granulocytes: 0 %
LYMPHS: 8 %
Lymphocytes Absolute: 0.8 10*3/uL (ref 0.7–3.1)
MCH: 30.9 pg (ref 26.6–33.0)
MCHC: 32.9 g/dL (ref 31.5–35.7)
MCV: 94 fL (ref 79–97)
MONOS ABS: 0.4 10*3/uL (ref 0.1–0.9)
Monocytes: 4 %
Neutrophils Absolute: 8.1 10*3/uL — ABNORMAL HIGH (ref 1.4–7.0)
Neutrophils: 88 %
Platelets: 224 10*3/uL (ref 150–379)
RBC: 4.89 x10E6/uL (ref 4.14–5.80)
RDW: 14.1 % (ref 12.3–15.4)
WBC: 9.4 10*3/uL (ref 3.4–10.8)

## 2014-11-22 NOTE — Telephone Encounter (Signed)
I called patient and relayed results.  

## 2014-11-22 NOTE — Telephone Encounter (Signed)
-----   Message from York Spaniel, MD sent at 11/22/2014  7:35 AM EDT -----  The blood work results are unremarkable, with exception that the random blood sugar was high. Please call the patient. ----- Message -----    From: [facility] Lab Results In Interface    Sent: 11/22/2014   5:40 AM      To: York Spaniel, MD

## 2014-12-08 ENCOUNTER — Other Ambulatory Visit: Payer: Self-pay | Admitting: Neurology

## 2014-12-09 ENCOUNTER — Other Ambulatory Visit: Payer: Self-pay | Admitting: Neurology

## 2014-12-31 ENCOUNTER — Other Ambulatory Visit: Payer: Self-pay | Admitting: Neurology

## 2015-01-15 ENCOUNTER — Other Ambulatory Visit: Payer: Self-pay | Admitting: Neurology

## 2015-02-20 ENCOUNTER — Telehealth: Payer: Self-pay | Admitting: Neurology

## 2015-02-20 DIAGNOSIS — Z5181 Encounter for therapeutic drug level monitoring: Secondary | ICD-10-CM

## 2015-02-20 NOTE — Telephone Encounter (Signed)
I called the patient. He is coming to get blood work drawn, he is on CellCept.

## 2015-02-24 ENCOUNTER — Other Ambulatory Visit (INDEPENDENT_AMBULATORY_CARE_PROVIDER_SITE_OTHER): Payer: Self-pay

## 2015-02-24 DIAGNOSIS — Z5181 Encounter for therapeutic drug level monitoring: Secondary | ICD-10-CM

## 2015-02-24 DIAGNOSIS — Z0289 Encounter for other administrative examinations: Secondary | ICD-10-CM

## 2015-02-25 LAB — COMPREHENSIVE METABOLIC PANEL
ALK PHOS: 44 IU/L (ref 39–117)
ALT: 16 IU/L (ref 0–44)
AST: 20 IU/L (ref 0–40)
Albumin/Globulin Ratio: 2 (ref 1.1–2.5)
Albumin: 4.1 g/dL (ref 3.6–4.8)
BUN/Creatinine Ratio: 9 — ABNORMAL LOW (ref 10–22)
BUN: 8 mg/dL (ref 8–27)
Bilirubin Total: 0.5 mg/dL (ref 0.0–1.2)
CALCIUM: 8.7 mg/dL (ref 8.6–10.2)
CO2: 26 mmol/L (ref 18–29)
CREATININE: 0.9 mg/dL (ref 0.76–1.27)
Chloride: 102 mmol/L (ref 96–106)
GFR calc Af Amer: 106 mL/min/{1.73_m2} (ref >59)
GFR, EST NON AFRICAN AMERICAN: 92 mL/min/{1.73_m2} (ref >59)
Globulin, Total: 2.1 g/dL (ref 1.5–4.5)
Glucose: 175 mg/dL — ABNORMAL HIGH (ref 65–99)
POTASSIUM: 4.6 mmol/L (ref 3.5–5.2)
SODIUM: 142 mmol/L (ref 134–144)
Total Protein: 6.2 g/dL (ref 6.0–8.5)

## 2015-02-25 LAB — CBC WITH DIFFERENTIAL/PLATELET
BASOS: 0 %
Basophils Absolute: 0 10*3/uL (ref 0.0–0.2)
EOS (ABSOLUTE): 0 10*3/uL (ref 0.0–0.4)
Eos: 0 %
Hematocrit: 44.3 % (ref 37.5–51.0)
Hemoglobin: 14.7 g/dL (ref 12.6–17.7)
IMMATURE GRANULOCYTES: 0 %
Immature Grans (Abs): 0 10*3/uL (ref 0.0–0.1)
LYMPHS: 10 %
Lymphocytes Absolute: 0.9 10*3/uL (ref 0.7–3.1)
MCH: 30.3 pg (ref 26.6–33.0)
MCHC: 33.2 g/dL (ref 31.5–35.7)
MCV: 91 fL (ref 79–97)
MONOS ABS: 0.5 10*3/uL (ref 0.1–0.9)
Monocytes: 5 %
NEUTROS PCT: 85 %
Neutrophils Absolute: 7.6 10*3/uL — ABNORMAL HIGH (ref 1.4–7.0)
PLATELETS: 227 10*3/uL (ref 150–379)
RBC: 4.85 x10E6/uL (ref 4.14–5.80)
RDW: 14.5 % (ref 12.3–15.4)
WBC: 9 10*3/uL (ref 3.4–10.8)

## 2015-02-27 ENCOUNTER — Telehealth: Payer: Self-pay

## 2015-02-27 NOTE — Telephone Encounter (Signed)
-----   Message from York Spanielharles K Willis, MD sent at 02/26/2015  5:13 PM EST -----  The blood work results are unremarkable. Please call the patient.  ----- Message -----    From: [facility] Lab Results In Interface    Sent: 02/25/2015   5:40 AM      To: York Spanielharles K Willis, MD

## 2015-02-27 NOTE — Telephone Encounter (Signed)
Called patient. Gave lab results. Patient verbalized understanding.  

## 2015-03-18 ENCOUNTER — Other Ambulatory Visit: Payer: Self-pay | Admitting: Neurology

## 2015-03-30 ENCOUNTER — Other Ambulatory Visit: Payer: Self-pay | Admitting: Neurology

## 2015-04-27 ENCOUNTER — Other Ambulatory Visit: Payer: Self-pay | Admitting: Neurology

## 2015-05-22 ENCOUNTER — Ambulatory Visit (INDEPENDENT_AMBULATORY_CARE_PROVIDER_SITE_OTHER): Payer: BC Managed Care – PPO | Admitting: Adult Health

## 2015-05-22 ENCOUNTER — Encounter: Payer: Self-pay | Admitting: Adult Health

## 2015-05-22 VITALS — BP 138/80 | HR 88 | Resp 20 | Ht 72.0 in | Wt 314.0 lb

## 2015-05-22 DIAGNOSIS — G7000 Myasthenia gravis without (acute) exacerbation: Secondary | ICD-10-CM | POA: Diagnosis not present

## 2015-05-22 DIAGNOSIS — Z5181 Encounter for therapeutic drug level monitoring: Secondary | ICD-10-CM

## 2015-05-22 MED ORDER — MYCOPHENOLATE MOFETIL 500 MG PO TABS: ORAL_TABLET | ORAL | Status: DC

## 2015-05-22 MED ORDER — PREDNISONE 10 MG PO TABS: ORAL_TABLET | ORAL | Status: DC

## 2015-05-22 NOTE — Patient Instructions (Signed)
Continue on Mestinon, cellcept and prednisone Blood work today  If your symptoms worsen or you develop new symptoms please let us know.

## 2015-05-22 NOTE — Progress Notes (Addendum)
PATIENT: Randall EstimableGeorge E Phillips DOB: 12/21/1955  REASON FOR VISIT: follow up- myasthenia gravis HISTORY FROM: patient  HISTORY OF PRESENT ILLNESS: Randall Phillips is a 60 year old male with a history of myasthenia gravis. He returns today for follow-up. The patient continues on CellCept and prednisone. He continues to take Mestinon but states that he has not had to use it as often as in the past.. The patient continues to have trouble chewing typically mid-meal. He continues to have weakness in the proximal muscles in the right arm. He states that he suffered a shoulder injury several years ago and contributes some of the weakness to this. He denies any trouble with breathing or swallowing. Denies any ptosis or diplopia. He states that he has remained stable. 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  HISTORY 11/21/14 (WILLIS): Randall Phillips is a 60 year old right-handed white male with a history of myasthenia gravis. The patient is stable from his last visit. He continues to have some fatigue with chewing, he has to assist his jaw with eating beginning around the mid-meal. The patient denies any choking. He continues to have some weakness of the proximal muscles of the arms, right greater than left. He does have some right shoulder discomfort at times. He denies any progression of his disease. He is not staying active, he is not monitoring his diabetes. He remains on prednisone 20 mg day, he is on CellCept and he is on Mestinon. He is tolerating these medications well. He returns to this office for an evaluation. He continues to work full-time.  REVIEW OF SYSTEMS: Out of a complete 14 system review of symptoms, the patient complains only of the following symptoms, and all other reviewed systems are negative.  See history of present illness  ALLERGIES: Allergies  Allergen Reactions  . Other     All seafood    HOME MEDICATIONS: Outpatient Prescriptions Prior to  Visit  Medication Sig Dispense Refill  . Calcium Carbonate-Vitamin D (CALTRATE 600+D) 600-400 MG-UNIT per tablet Take 1 tablet by mouth daily.    . COCONUT OIL PO Take by mouth daily.    . fish oil-omega-3 fatty acids 1000 MG capsule Take 2 g by mouth daily.    . mycophenolate (CELLCEPT) 500 MG tablet TAKE 2 TABLETS BY MOUTH IN THE MORNING AND TAKE 3 TABLETS IN THE EVENING 150 tablet 1  . predniSONE (DELTASONE) 10 MG tablet TAKE 2 TABLETS (20 MG TOTAL) BY MOUTH DAILY WITH BREAKFAST. 60 tablet 6  . pyridostigmine (MESTINON) 60 MG tablet TAKE 1 TABLET BY MOUTH EVERY 3 TO 4 HOURS AS NEEDED 240 tablet 0   No facility-administered medications prior to visit.    PAST MEDICAL HISTORY: Past Medical History  Diagnosis Date  . Myasthenia gravis (HCC)   . Obesity   . Hx of renal calculi   . History of renal calculi   . 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 2010  . Vision abnormalities     Left eye blind  . Diabetes mellitus without complication (HCC)     diet controlled    PAST SURGICAL HISTORY: Past Surgical History  Procedure Laterality Date  . Tonsillectomy    . Thymectomy      Status post    FAMILY HISTORY: Family History  Problem Relation Age of Onset  . Cancer - Lung Mother   . Diabetes Mother   .  Cerebrovascular Disease Father   . Stroke Father 63  . Diabetes Father     SOCIAL HISTORY: Social History   Social History  . Marital Status: Married    Spouse Name: N/A  . Number of Children: 0  . Years of Education: College   Occupational History  .  [employer]   Social History Main Topics  . Smoking status: Never Smoker   . Smokeless tobacco: Never Used  . Alcohol Use: No  . Drug Use: No  . Sexual Activity: Not on file   Other Topics Concern  . Not on file   Social History Narrative   Patient is right handed.   Patient drinks caffeine occasionally.       PHYSICAL EXAM  Filed Vitals:   05/22/15 0751  BP: 138/80  Pulse: 88  Resp: 20  Height: 6' (1.829 m)  Weight: 314 lb (142.429 kg)   Body mass index is 42.58 kg/(m^2).  Generalized: Well developed, in no acute distress   Neurological examination  Mentation: Alert oriented to time, place, history taking. Follows all commands speech and language fluent Cranial nerve II-XII: Pupils were equal round reactive to light. Extraocular movements were fullExcept the left eye which has a divergent gaze. visual field were full on confrontational test. Facial sensation and strength were normal. Uvula tongue midline. Good eye closure-no weakness noted. No puff cheek weakness noted. Head turning and shoulder shrug  were normal and symmetric. No ptosis or diplopia noted with superior gaze for 1 minute. Motor: The motor testing reveals 5 over 5 strength of all 4 extremities. Good symmetric motor tone is noted throughout. With arms abducted for 1 minute the right arm is unable to sustain this position. There is mild weakness in the left arm after 1 minute. Sensory: Sensory testing is intact to soft touch on all 4 extremities. No evidence of extinction is noted.  Coordination: Cerebellar testing reveals good finger-nose-finger and heel-to-shin bilaterally.  Gait and station: Gait is normal. Tandem gait is normal. Romberg is negative. No drift is seen.  Reflexes: Deep tendon reflexes are symmetric and normal bilaterally.   DIAGNOSTIC DATA (LABS, IMAGING, TESTING) - I reviewed patient records, labs, notes, testing and imaging myself where available.  Lab Results  Component Value Date   WBC 9.0 02/24/2015   HGB 15.1 05/19/2014   HCT 44.3 02/24/2015   MCV 91 02/24/2015   PLT 227 02/24/2015      Component Value Date/Time   NA 142 02/24/2015 0849   NA 139 11/11/2009 1032   K 4.6 02/24/2015 0849   CL 102 02/24/2015 0849   CO2 26 02/24/2015 0849   GLUCOSE 175* 02/24/2015 0849   GLUCOSE 260*  11/11/2009 1032   BUN 8 02/24/2015 0849   BUN 11 11/11/2009 1032   CREATININE 0.90 02/24/2015 0849   CALCIUM 8.7 02/24/2015 0849   PROT 6.2 02/24/2015 0849   PROT 5.6* 11/05/2009 0941   ALBUMIN 4.1 02/24/2015 0849   ALBUMIN 4.1 11/11/2009 1032   AST 20 02/24/2015 0849   ALT 16 02/24/2015 0849   ALKPHOS 44 02/24/2015 0849   BILITOT 0.5 02/24/2015 0849   BILITOT 0.3 02/17/2014 1454   GFRNONAA 92 02/24/2015 0849   GFRAA 106 02/24/2015 0849     ASSESSMENT AND PLAN 60 y.o. year old male  has a past medical history of Myasthenia gravis (HCC); Obesity; renal calculi; History of renal calculi; Ventilator dependence (HCC); Vision abnormalities; and Diabetes mellitus without complication (HCC). here with:  1. Myasthenia gravis  Overall the patient has remained stable. He will continue on CellCept, Mestinon and prednisone. I have sent in for refills. I will check blood work today. Patient advised that if his symptoms worsen or he develops any new symptoms he should let us know. He will follow-up in 6 months or sooner if needed.   Butch Penny, MSN, NP-C 05/22/2015, 7:55 AM [facility] [address] [place], [place] [address] [phone]   Personally examined images, and have participated in and made any corrections needed to history, physical, neuro exam,assessment and plan as stated above and agree with plan.    Naomie Dean, MD Neurology [facility]

## 2015-05-23 LAB — COMPREHENSIVE METABOLIC PANEL
A/G RATIO: 1.8 (ref 1.2–2.2)
ALT: 19 IU/L (ref 0–44)
AST: 23 IU/L (ref 0–40)
Albumin: 4.1 g/dL (ref 3.6–4.8)
Alkaline Phosphatase: 43 IU/L (ref 39–117)
BILIRUBIN TOTAL: 0.4 mg/dL (ref 0.0–1.2)
BUN/Creatinine Ratio: 17 (ref 10–22)
BUN: 16 mg/dL (ref 8–27)
CHLORIDE: 103 mmol/L (ref 96–106)
CO2: 19 mmol/L (ref 18–29)
Calcium: 8.9 mg/dL (ref 8.6–10.2)
Creatinine, Ser: 0.94 mg/dL (ref 0.76–1.27)
GFR calc non Af Amer: 87 mL/min/{1.73_m2} (ref >59)
GFR, EST AFRICAN AMERICAN: 101 mL/min/{1.73_m2} (ref >59)
Globulin, Total: 2.3 g/dL (ref 1.5–4.5)
Glucose: 190 mg/dL — ABNORMAL HIGH (ref 65–99)
POTASSIUM: 4.3 mmol/L (ref 3.5–5.2)
Sodium: 144 mmol/L (ref 134–144)
Total Protein: 6.4 g/dL (ref 6.0–8.5)

## 2015-05-23 LAB — CBC WITH DIFFERENTIAL/PLATELET
BASOS: 0 %
Basophils Absolute: 0 10*3/uL (ref 0.0–0.2)
EOS (ABSOLUTE): 0 10*3/uL (ref 0.0–0.4)
EOS: 0 %
HEMATOCRIT: 45.9 % (ref 37.5–51.0)
HEMOGLOBIN: 14.8 g/dL (ref 12.6–17.7)
Immature Grans (Abs): 0 10*3/uL (ref 0.0–0.1)
Immature Granulocytes: 0 %
LYMPHS ABS: 0.7 10*3/uL (ref 0.7–3.1)
Lymphs: 9 %
MCH: 30.6 pg (ref 26.6–33.0)
MCHC: 32.2 g/dL (ref 31.5–35.7)
MCV: 95 fL (ref 79–97)
MONOCYTES: 6 %
MONOS ABS: 0.4 10*3/uL (ref 0.1–0.9)
Neutrophils Absolute: 6.5 10*3/uL (ref 1.4–7.0)
Neutrophils: 85 %
PLATELETS: 207 10*3/uL (ref 150–379)
RBC: 4.83 x10E6/uL (ref 4.14–5.80)
RDW: 14.1 % (ref 12.3–15.4)
WBC: 7.8 10*3/uL (ref 3.4–10.8)

## 2015-08-29 ENCOUNTER — Telehealth: Payer: Self-pay | Admitting: Adult Health

## 2015-08-29 DIAGNOSIS — Z5181 Encounter for therapeutic drug level monitoring: Secondary | ICD-10-CM

## 2015-08-29 NOTE — Telephone Encounter (Signed)
Please call patient and have him come in for blood work in the next week or so. Orders placed.

## 2015-08-30 NOTE — Telephone Encounter (Signed)
Spoke to pt and relayed the lab request in the next week or so.  He verbalized understanding.   Hours given.   He asked what time lab tech gets in ([phone]).

## 2015-09-06 ENCOUNTER — Other Ambulatory Visit (INDEPENDENT_AMBULATORY_CARE_PROVIDER_SITE_OTHER): Payer: Self-pay

## 2015-09-06 DIAGNOSIS — Z5181 Encounter for therapeutic drug level monitoring: Secondary | ICD-10-CM

## 2015-09-06 DIAGNOSIS — Z0289 Encounter for other administrative examinations: Secondary | ICD-10-CM

## 2015-09-07 ENCOUNTER — Telehealth: Payer: Self-pay

## 2015-09-07 ENCOUNTER — Telehealth: Payer: Self-pay | Admitting: *Deleted

## 2015-09-07 LAB — CBC WITH DIFFERENTIAL/PLATELET
BASOS ABS: 0 10*3/uL (ref 0.0–0.2)
BASOS: 0 %
EOS (ABSOLUTE): 0.1 10*3/uL (ref 0.0–0.4)
Eos: 1 %
Hematocrit: 45 % (ref 37.5–51.0)
Hemoglobin: 14.6 g/dL (ref 12.6–17.7)
IMMATURE GRANS (ABS): 0 10*3/uL (ref 0.0–0.1)
Immature Granulocytes: 0 %
LYMPHS ABS: 1.1 10*3/uL (ref 0.7–3.1)
LYMPHS: 12 %
MCH: 31 pg (ref 26.6–33.0)
MCHC: 32.4 g/dL (ref 31.5–35.7)
MCV: 96 fL (ref 79–97)
MONOS ABS: 0.6 10*3/uL (ref 0.1–0.9)
Monocytes: 7 %
NEUTROS ABS: 7.2 10*3/uL — AB (ref 1.4–7.0)
Neutrophils: 80 %
PLATELETS: 217 10*3/uL (ref 150–379)
RBC: 4.71 x10E6/uL (ref 4.14–5.80)
RDW: 14.4 % (ref 12.3–15.4)
WBC: 9 10*3/uL (ref 3.4–10.8)

## 2015-09-07 LAB — COMPREHENSIVE METABOLIC PANEL
A/G RATIO: 1.7 (ref 1.2–2.2)
ALT: 19 IU/L (ref 0–44)
AST: 20 IU/L (ref 0–40)
Albumin: 4 g/dL (ref 3.6–4.8)
Alkaline Phosphatase: 44 IU/L (ref 39–117)
BILIRUBIN TOTAL: 0.5 mg/dL (ref 0.0–1.2)
BUN/Creatinine Ratio: 9 — ABNORMAL LOW (ref 10–24)
BUN: 9 mg/dL (ref 8–27)
CHLORIDE: 102 mmol/L (ref 96–106)
CO2: 21 mmol/L (ref 18–29)
Calcium: 8.6 mg/dL (ref 8.6–10.2)
Creatinine, Ser: 1 mg/dL (ref 0.76–1.27)
GFR calc non Af Amer: 81 mL/min/{1.73_m2} (ref >59)
GFR, EST AFRICAN AMERICAN: 93 mL/min/{1.73_m2} (ref >59)
GLOBULIN, TOTAL: 2.3 g/dL (ref 1.5–4.5)
Glucose: 170 mg/dL — ABNORMAL HIGH (ref 65–99)
POTASSIUM: 4.1 mmol/L (ref 3.5–5.2)
SODIUM: 139 mmol/L (ref 134–144)
TOTAL PROTEIN: 6.3 g/dL (ref 6.0–8.5)

## 2015-09-07 NOTE — Telephone Encounter (Signed)
I called pt to advise him that his appt on 9/14 with Megan, NP needs to be rescheduled. Pt is agreeable to 9/12 at 8:00. Pt verbalized understanding of new appt, date and time.

## 2015-09-07 NOTE — Telephone Encounter (Signed)
-----   Message from Butch PennyMegan Millikan, NP sent at 09/07/2015  7:33 AM EDT ----- Lab work ok. Glucose elevated. Neutrophils slightly elevated. Will continue to monitor. Please call patient.

## 2015-09-07 NOTE — Telephone Encounter (Signed)
I spoke to pt and relayed the lab results (elevated glucose, neutrophils slightly elevated).  Will monitor. No additional lab work at this time.  Pt verbalized understanding.

## 2015-10-25 ENCOUNTER — Other Ambulatory Visit: Payer: Self-pay | Admitting: *Deleted

## 2015-10-25 MED ORDER — PYRIDOSTIGMINE BROMIDE 60 MG PO TABS: ORAL_TABLET | ORAL | 1 refills | Status: DC

## 2015-10-25 NOTE — Telephone Encounter (Signed)
Pt has appt 11-21-15 at 0800 with MM/NP.

## 2015-11-21 ENCOUNTER — Ambulatory Visit: Payer: Self-pay | Admitting: Adult Health

## 2015-11-21 ENCOUNTER — Encounter: Payer: Self-pay | Admitting: Adult Health

## 2015-11-21 ENCOUNTER — Ambulatory Visit (INDEPENDENT_AMBULATORY_CARE_PROVIDER_SITE_OTHER): Payer: BC Managed Care – PPO | Admitting: Adult Health

## 2015-11-21 VITALS — BP 162/96 | HR 100 | Resp 20 | Ht 72.0 in | Wt 307.0 lb

## 2015-11-21 DIAGNOSIS — Z5181 Encounter for therapeutic drug level monitoring: Secondary | ICD-10-CM | POA: Diagnosis not present

## 2015-11-21 DIAGNOSIS — G7000 Myasthenia gravis without (acute) exacerbation: Secondary | ICD-10-CM | POA: Diagnosis not present

## 2015-11-21 NOTE — Progress Notes (Signed)
I have read the note, and I agree with the clinical assessment and plan.  Randall Phillips,Randall Phillips   

## 2015-11-21 NOTE — Patient Instructions (Signed)
Continue mestinon, cellcept and prednisone Blood work today If your symptoms worsen or you develop new symptoms please let us know.

## 2015-11-21 NOTE — Progress Notes (Signed)
PATIENT: Randall Phillips DOB: 09/05/1955  REASON FOR VISIT: follow up- myasthenia gravis HISTORY FROM: patient  HISTORY OF PRESENT ILLNESS: Randall Phillips is a 60 year old male with a history of myasthenia gravis. He returns today for follow-up. He is currently on CellCept, Mestinon and prednisone. He reports overall he is doing well. Denies ptosis and diplopia. Denies any trouble breathing. He reports occasionally he will have trouble chewing typically mid-meal. He also has some weakness in the right arm however he continues to contributes this to a shoulder injury several years ago. He reports that he is tolerating the medication well. He states that he is diabetic however he does not check his sugar regularly. He is not on any medication for diabetes. 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  HISTORY 05/22/15: Randall Phillips is a 60 year old male with a history of myasthenia gravis. He returns today for follow-up. The patient continues on CellCept and prednisone. He continues to take Mestinon but states that he has not had to use it as often as in the past.. The patient continues to have trouble chewing typically mid-meal. He continues to have weakness in the proximal muscles in the right arm. He states that he suffered a shoulder injury several years ago and contributes some of the weakness to this. He denies any trouble with breathing or swallowing. Denies any ptosis or diplopia. He states that he has remained stable. 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  HISTORY 11/21/14 (WILLIS): Randall Phillips is a 60 year old right-handed white male with a history of myasthenia gravis. The patient is stable from his last visit. He continues to have some fatigue with chewing, he has to assist his jaw with eating beginning around the mid-meal. The patient denies any choking. He continues to have some weakness of the proximal muscles of the arms, right greater than left. He does  have some right shoulder discomfort at times. He denies any progression of his disease. He is not staying active, he is not monitoring his diabetes. He remains on prednisone 20 mg day, he is on CellCept and he is on Mestinon. He is tolerating these medications well. He returns to this office for an evaluation. He continues to work full-time.   REVIEW OF SYSTEMS: Out of a complete 14 system review of symptoms, the patient complains only of the following symptoms, and all other reviewed systems are negative.  See hpi  ALLERGIES: Allergies  Allergen Reactions  . Other     All seafood    HOME MEDICATIONS: Outpatient Medications Prior to Visit  Medication Sig Dispense Refill  . Calcium Carbonate-Vitamin D (CALTRATE 600+D) 600-400 MG-UNIT per tablet Take 1 tablet by mouth daily.    . COCONUT OIL PO Take by mouth daily.    . fish oil-omega-3 fatty acids 1000 MG capsule Take 2 g by mouth daily.    . mycophenolate (CELLCEPT) 500 MG tablet TAKE 2 TABLETS BY MOUTH IN THE MORNING AND TAKE 3 TABLETS IN THE EVENING 150 tablet 6  . predniSONE (DELTASONE) 10 MG tablet TAKE 2 TABLETS (20 MG TOTAL) BY MOUTH DAILY WITH BREAKFAST. 60 tablet 6  . pyridostigmine (MESTINON) 60 MG tablet TAKE 1 TABLET BY MOUTH EVERY 3 TO 4 HOURS AS NEEDED 240 tablet 1   No facility-administered medications prior to visit.     PAST MEDICAL HISTORY: Past Medical History:  Diagnosis Date  . Diabetes mellitus without complication (HCC)    diet controlled  .  History of renal calculi   . Hx of renal calculi   . Myasthenia gravis (HCC)   . Obesity   . 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 2010  . Vision abnormalities    Left eye blind    PAST SURGICAL HISTORY: Past Surgical History:  Procedure Laterality Date  . THYMECTOMY     Status post  . TONSILLECTOMY      FAMILY HISTORY: Family History  Problem Relation Age of Onset  .  Cancer - Lung Mother   . Diabetes Mother   . Cerebrovascular Disease Father   . Stroke Father 61  . Diabetes Father     SOCIAL HISTORY: Social History   Social History  . Marital status: Married    Spouse name: N/A  . Number of children: 0  . Years of education: College   Occupational History  .  [employer]   Social History Main Topics  . Smoking status: Never Smoker  . Smokeless tobacco: Never Used  . Alcohol use No  . Drug use: No  . Sexual activity: Not on file   Other Topics Concern  . Not on file   Social History Narrative   Patient is right handed.   Patient drinks caffeine occasionally.      PHYSICAL EXAM  Vitals:   11/21/15 1542  BP: (!) 162/96  Pulse: 100  Resp: 20  Weight: (!) 307 lb (139.3 kg)  Height: 6' (1.829 m)   Body mass index is 41.64 kg/m.  Generalized: Well developed, in no acute distress   Neurological examination  Mentation: Alert oriented to time, place, history taking. Follows all commands speech and language fluent Cranial nerve II-XII: Pupils were equal round reactive to light. Extraocular movements were full with exception of left eye which has divergent gaze. visual field were full on confrontational test. Facial sensation and strength were normal. Uvula tongue midline. Head turning and shoulder shrug  were normal and symmetric. Superior gaze for 1 minute= no diplopia or ptosis Motor: The motor testing reveals 5 over 5 strength of all 4 extremities. Good symmetric motor tone is noted throughout. Arms abducted from 1 minute no weakness noted in the left. Unable to keep right arm abducted due to pain in shoulder. Sensory: Sensory testing is intact to soft touch on all 4 extremities. No evidence of extinction is noted.  Coordination: Cerebellar testing reveals good finger-nose-finger and heel-to-shin bilaterally.  Gait and station: Gait is normal.   Reflexes: Deep tendon reflexes are symmetric and normal bilaterally.    DIAGNOSTIC DATA (LABS, IMAGING, TESTING) - I reviewed patient records, labs, notes, testing and imaging myself where available.  Lab Results  Component Value Date   WBC 9.0 09/06/2015   HGB 15.1 05/19/2014   HCT 45.0 09/06/2015   MCV 96 09/06/2015   PLT 217 09/06/2015      Component Value Date/Time   NA 139 09/06/2015 0813   K 4.1 09/06/2015 0813   CL 102 09/06/2015 0813   CO2 21 09/06/2015 0813   GLUCOSE 170 (H) 09/06/2015 0813   GLUCOSE 260 (H) 11/11/2009 1032   BUN 9 09/06/2015 0813   CREATININE 1.00 09/06/2015 0813   CALCIUM 8.6 09/06/2015 0813   PROT 6.3 09/06/2015 0813   ALBUMIN 4.0 09/06/2015 0813   AST 20 09/06/2015 0813   ALT 19 09/06/2015 0813   ALKPHOS 44 09/06/2015 0813   BILITOT 0.5 09/06/2015 0813  GFRNONAA 81 09/06/2015 0813   GFRAA 93 09/06/2015 0813     ASSESSMENT AND PLAN 60 y.o. year old male  has a past medical history of Diabetes mellitus without complication (HCC); History of renal calculi; renal calculi; Myasthenia gravis (HCC); Obesity; Ventilator dependence (HCC); and Vision abnormalities. here with:  1. Myasthenia gravis  Overall the patient has remained stable. He will continue on Mestinon, prednisone and CellCept. I will check blood work today. Patient advised that if his symptoms worsen or he develops any new symptoms he should let us know. He will plan to follow-up in 6 months with Dr. Anne Hahn.     Butch Penny, MSN, NP-C 11/21/2015, 3:20 PM [facility] [address] [place], [place] [address] [phone]

## 2015-11-22 LAB — COMPREHENSIVE METABOLIC PANEL
A/G RATIO: 2 (ref 1.2–2.2)
ALT: 20 IU/L (ref 0–44)
AST: 22 IU/L (ref 0–40)
Albumin: 4.3 g/dL (ref 3.6–4.8)
Alkaline Phosphatase: 47 IU/L (ref 39–117)
BUN/Creatinine Ratio: 11 (ref 10–24)
BUN: 10 mg/dL (ref 8–27)
Bilirubin Total: 0.3 mg/dL (ref 0.0–1.2)
CALCIUM: 9.8 mg/dL (ref 8.6–10.2)
CO2: 26 mmol/L (ref 18–29)
CREATININE: 0.88 mg/dL (ref 0.76–1.27)
Chloride: 102 mmol/L (ref 96–106)
GFR, EST AFRICAN AMERICAN: 106 mL/min/{1.73_m2} (ref >59)
GFR, EST NON AFRICAN AMERICAN: 92 mL/min/{1.73_m2} (ref >59)
GLUCOSE: 157 mg/dL — AB (ref 65–99)
Globulin, Total: 2.2 g/dL (ref 1.5–4.5)
Potassium: 4.5 mmol/L (ref 3.5–5.2)
Sodium: 142 mmol/L (ref 134–144)
TOTAL PROTEIN: 6.5 g/dL (ref 6.0–8.5)

## 2015-11-22 LAB — CBC WITH DIFFERENTIAL/PLATELET
BASOS: 0 %
Basophils Absolute: 0 10*3/uL (ref 0.0–0.2)
EOS (ABSOLUTE): 0 10*3/uL (ref 0.0–0.4)
EOS: 0 %
HEMOGLOBIN: 15.2 g/dL (ref 12.6–17.7)
Hematocrit: 44.9 % (ref 37.5–51.0)
IMMATURE GRANS (ABS): 0 10*3/uL (ref 0.0–0.1)
Immature Granulocytes: 0 %
LYMPHS: 14 %
Lymphocytes Absolute: 1.1 10*3/uL (ref 0.7–3.1)
MCH: 31 pg (ref 26.6–33.0)
MCHC: 33.9 g/dL (ref 31.5–35.7)
MCV: 91 fL (ref 79–97)
MONOCYTES: 8 %
Monocytes Absolute: 0.7 10*3/uL (ref 0.1–0.9)
NEUTROS ABS: 6.5 10*3/uL (ref 1.4–7.0)
Neutrophils: 78 %
Platelets: 232 10*3/uL (ref 150–379)
RBC: 4.91 x10E6/uL (ref 4.14–5.80)
RDW: 14.3 % (ref 12.3–15.4)
WBC: 8.4 10*3/uL (ref 3.4–10.8)

## 2015-11-23 ENCOUNTER — Telehealth: Payer: Self-pay

## 2015-11-23 ENCOUNTER — Ambulatory Visit: Payer: BC Managed Care – PPO | Admitting: Adult Health

## 2015-11-23 NOTE — Telephone Encounter (Signed)
I spoke to pt and advised him that his glucose is elevated but this is similar to past readings. I advised him that his labs were otherwise unremarkable and to continue his current treatment plan. Pt verbalized understanding of results. Pt had no questions at this time but was encouraged to call back if questions arise.

## 2015-11-23 NOTE — Telephone Encounter (Signed)
-----   Message from Suanne MarkerVikram R Penumalli, MD sent at 11/23/2015 12:19 PM EDT ----- Glucose elevated, but similar to the past. Otherwise unremarkable labs. Continue current plan. Please call patient. -VRP

## 2015-12-11 ENCOUNTER — Telehealth: Payer: Self-pay | Admitting: Neurology

## 2015-12-21 ENCOUNTER — Other Ambulatory Visit: Payer: Self-pay | Admitting: Adult Health

## 2015-12-21 NOTE — Telephone Encounter (Signed)
[facility] [phone] opt 2 called to advise the script was rec'd and then it was c/a. She does not know if that was on their side or what but she needs to know if the pt still needs this RX

## 2015-12-22 NOTE — Telephone Encounter (Signed)
Called pt and he agreed to call specialty pharmacy to give permission for shipment.

## 2015-12-22 NOTE — Telephone Encounter (Signed)
Called specialty pharmacy to let then know that pt does still need requested medication.

## 2016-02-21 ENCOUNTER — Other Ambulatory Visit: Payer: Self-pay | Admitting: Neurology

## 2016-02-23 ENCOUNTER — Other Ambulatory Visit: Payer: Self-pay | Admitting: Adult Health

## 2016-04-16 ENCOUNTER — Other Ambulatory Visit: Payer: Self-pay | Admitting: Neurology

## 2016-04-29 NOTE — Telephone Encounter (Signed)
Received fax notification from [facility] that rx mycophenolate  (Cellcept) shipped to pt on 04/23/16.

## 2016-05-27 ENCOUNTER — Ambulatory Visit (INDEPENDENT_AMBULATORY_CARE_PROVIDER_SITE_OTHER): Payer: BC Managed Care – PPO | Admitting: Neurology

## 2016-05-27 ENCOUNTER — Encounter: Payer: Self-pay | Admitting: Neurology

## 2016-05-27 VITALS — BP 121/75 | HR 91 | Ht 72.0 in | Wt 309.0 lb

## 2016-05-27 DIAGNOSIS — G7000 Myasthenia gravis without (acute) exacerbation: Secondary | ICD-10-CM | POA: Diagnosis not present

## 2016-05-27 DIAGNOSIS — Z5181 Encounter for therapeutic drug level monitoring: Secondary | ICD-10-CM

## 2016-05-27 MED ORDER — PREDNISONE 5 MG PO TABS: 7.5000 mg | ORAL_TABLET | Freq: Every day | ORAL | 3 refills | Status: DC

## 2016-05-27 NOTE — Progress Notes (Signed)
Reason for visit: Myasthenia gravis  Randall Phillips is an 61 y.o. male  History of present illness:  [name] is a [age] year old right-handed white male with a history of myasthenia gravis. The patient mainly has issues with chewing, he will fatigue before he can get through a meal. The patient also has some fatigue issues with the right deltoid muscle. He denies any problems with choking with swallowing, he has had no change in his speech. His job now is localized to one office, he does not travel much. He is eating some better. He has diabetes but he does not check his blood sugars at all, he is not on any medications for his blood sugar. The patient does not follow-up with his primary care physician on a regular basis. The patient indicates that he is considering a ketogenic diet. He denies any new issues with his myasthenia. He does not have double vision, but he has strabismus involving the left eye, and he has always been extremely right eye dominant. He denies any ptosis of the eyes. He returns for an evaluation.  Past Medical History:  Diagnosis Date  . Diabetes mellitus without complication (HCC)    diet controlled  . History of renal calculi   . Hx of renal calculi   . Myasthenia gravis (HCC)   . Obesity   . 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 2010  . Vision abnormalities    Left eye blind    Past Surgical History:  Procedure Laterality Date  . THYMECTOMY     Status post  . TONSILLECTOMY      Family History  Problem Relation Age of Onset  . Cancer - Lung Mother   . Diabetes Mother   . Cerebrovascular Disease Father   . Stroke Father [id number]  . Diabetes Father     Social history:  reports that he has never smoked. He has never used smokeless tobacco. He reports that he does not drink alcohol or use drugs.    Allergies  Allergen Reactions  . Other     All seafood     Medications:  Prior to Admission medications   Medication Sig Start Date End Date Taking? Authorizing Provider  Calcium Carbonate-Vitamin D (CALTRATE 600+D) 600-400 MG-UNIT per tablet Take 1 tablet by mouth daily.   Yes Historical Provider, MD  COCONUT OIL PO Take by mouth daily.   Yes Historical Provider, MD  fish oil-omega-3 fatty acids 1000 MG capsule Take 2 g by mouth daily.   Yes Historical Provider, MD  mycophenolate (CELLCEPT) 500 MG tablet TAKE 2 TABLETS BY MOUTH IN THE MORNING AND TAKE 3 TABLETS IN THE EVENING 12/20/15  Yes York Spanielharles K Laneshia Pina, MD  predniSONE (DELTASONE) 10 MG tablet TAKE 2 TABLETS (20 MG TOTAL) BY MOUTH DAILY WITH BREAKFAST. 02/27/16  Yes York Spanielharles K Annastyn Silvey, MD  pyridostigmine (MESTINON) 60 MG tablet TAKE 1 TABLET BY MOUTH EVERY 3-4 HOURS AS NEEDED 04/16/16  Yes York Spanielharles K Tyannah Sane, MD  predniSONE (DELTASONE) 5 MG tablet Take 1.5 tablets (7.5 mg total) by mouth daily with breakfast. 05/27/16   York Spanielharles K Lakyn Mantione, MD    ROS:  Out of a complete 14 system review of symptoms, the patient complains only of the following symptoms, and all other reviewed systems are negative.  Muscle weakness  Blood pressure 121/75, pulse 91, height 6' (1.829 m), weight (!) 309 lb (140.2 kg), SpO2 94 %.  Physical Exam  General: The patient is alert and cooperative at the time of the examination. The patient is markedly obese.  Skin: No significant peripheral edema is noted.   Neurologic Exam  Mental status: The patient is alert and oriented x 3 at the time of the examination. The patient has apparent normal recent and remote memory, with an apparently normal attention span and concentration ability.   Cranial nerves: Facial symmetry is present. Speech is normal, no aphasia or dysarthria is noted. Extraocular movements are full, but the left eye has nasal deviation at primary gaze. Visual fields are full. With superior gaze for 1 minute, no ptosis is seen.  Motor: The patient has good  strength in all 4 extremities, with exception of some fatigable weakness of the right deltoid. The patient is able to elevate the right arm for about 45 seconds, he is able to maintain elevation of the left arm for 1 minute.  Sensory examination: Soft touch sensation is symmetric on the face, arms, and legs.  Coordination: The patient has good finger-nose-finger and heel-to-shin bilaterally.  Gait and station: The patient has a normal gait. Tandem gait is unsteady. Romberg is negative. No drift is seen.  Reflexes: Deep tendon reflexes are symmetric.   Assessment/Plan:  1. Myasthenia gravis  2. Diabetes  The patient will be sent for blood work today, this will include a hemoglobin A1c. I have encouraged him to follow-up with his primary care physician regarding his diabetes. The patient will follow-up in about 6 months, sooner if needed. We will drop the prednisone dose to 17.5 mg daily. We will recheck blood work in 3 months.  Randall Palau MD 05/27/2016 8:13 AM  [facility] [address] [place], [place] [address]  Phone [phone] Fax [phone]

## 2016-05-27 NOTE — Patient Instructions (Signed)
   Reduce the prednisone to 17.5 mg a day, take one 10 mg tablet and 1.5 of the 5 mg tablets.

## 2016-05-28 ENCOUNTER — Telehealth: Payer: Self-pay | Admitting: *Deleted

## 2016-05-28 LAB — COMPREHENSIVE METABOLIC PANEL
ALT: 22 IU/L (ref 0–44)
AST: 30 IU/L (ref 0–40)
Albumin/Globulin Ratio: 1.9 (ref 1.2–2.2)
Albumin: 4.2 g/dL (ref 3.6–4.8)
Alkaline Phosphatase: 42 IU/L (ref 39–117)
BILIRUBIN TOTAL: 0.5 mg/dL (ref 0.0–1.2)
BUN/Creatinine Ratio: 12 (ref 10–24)
BUN: 11 mg/dL (ref 8–27)
CHLORIDE: 101 mmol/L (ref 96–106)
CO2: 23 mmol/L (ref 18–29)
Calcium: 8.8 mg/dL (ref 8.6–10.2)
Creatinine, Ser: 0.91 mg/dL (ref 0.76–1.27)
GFR calc non Af Amer: 90 mL/min/{1.73_m2} (ref >59)
GFR, EST AFRICAN AMERICAN: 104 mL/min/{1.73_m2} (ref >59)
GLUCOSE: 198 mg/dL — AB (ref 65–99)
Globulin, Total: 2.2 g/dL (ref 1.5–4.5)
Potassium: 4.3 mmol/L (ref 3.5–5.2)
Sodium: 141 mmol/L (ref 134–144)
TOTAL PROTEIN: 6.4 g/dL (ref 6.0–8.5)

## 2016-05-28 LAB — CBC WITH DIFFERENTIAL/PLATELET
BASOS ABS: 0 10*3/uL (ref 0.0–0.2)
Basos: 0 %
EOS (ABSOLUTE): 0.1 10*3/uL (ref 0.0–0.4)
Eos: 1 %
HEMOGLOBIN: 14.5 g/dL (ref 13.0–17.7)
Hematocrit: 45.5 % (ref 37.5–51.0)
IMMATURE GRANS (ABS): 0 10*3/uL (ref 0.0–0.1)
IMMATURE GRANULOCYTES: 0 %
LYMPHS: 9 %
Lymphocytes Absolute: 0.7 10*3/uL (ref 0.7–3.1)
MCH: 29.7 pg (ref 26.6–33.0)
MCHC: 31.9 g/dL (ref 31.5–35.7)
MCV: 93 fL (ref 79–97)
MONOCYTES: 5 %
Monocytes Absolute: 0.4 10*3/uL (ref 0.1–0.9)
NEUTROS ABS: 6.8 10*3/uL (ref 1.4–7.0)
NEUTROS PCT: 85 %
Platelets: 215 10*3/uL (ref 150–379)
RBC: 4.89 x10E6/uL (ref 4.14–5.80)
RDW: 14.8 % (ref 12.3–15.4)
WBC: 8 10*3/uL (ref 3.4–10.8)

## 2016-05-28 LAB — HEMOGLOBIN A1C
ESTIMATED AVERAGE GLUCOSE: 174 mg/dL
HEMOGLOBIN A1C: 7.7 % — AB (ref 4.8–5.6)

## 2016-05-28 NOTE — Telephone Encounter (Signed)
Called and spoke with pt about lab results per CW,MD note. Encouraged pt to f/u with PCP. He verbalized understanding.

## 2016-05-28 NOTE — Telephone Encounter (Signed)
-----   Message from York Spanielharles K Willis, MD sent at 05/28/2016  7:29 AM EDT -----   The blood work results are unremarkable with exception of an elevated blood glucose level and Hgb A1c is elevated at 7.7. I have encouraged follow up with primary doctor. Please call the patient. ----- Message ----- From: Nell RangeInterface, [facility] Lab Results In Sent: 05/28/2016   5:40 AM To: York Spanielharles K Willis, MD

## 2016-06-26 ENCOUNTER — Telehealth: Payer: Self-pay | Admitting: *Deleted

## 2016-06-26 NOTE — Telephone Encounter (Signed)
Received [facility] from [facility] that rx mycophenolate shipped to patient 06/20/2016.

## 2016-08-12 ENCOUNTER — Other Ambulatory Visit: Payer: Self-pay | Admitting: Neurology

## 2016-08-23 ENCOUNTER — Telehealth: Payer: Self-pay | Admitting: Neurology

## 2016-08-23 DIAGNOSIS — Z5181 Encounter for therapeutic drug level monitoring: Secondary | ICD-10-CM

## 2016-08-23 NOTE — Telephone Encounter (Signed)
I called the patient. He is to come in sometime next week to get blood work drawn on CellCept.

## 2016-08-26 ENCOUNTER — Other Ambulatory Visit (INDEPENDENT_AMBULATORY_CARE_PROVIDER_SITE_OTHER): Payer: Self-pay

## 2016-08-26 DIAGNOSIS — Z5181 Encounter for therapeutic drug level monitoring: Secondary | ICD-10-CM

## 2016-08-26 DIAGNOSIS — Z0289 Encounter for other administrative examinations: Secondary | ICD-10-CM

## 2016-08-27 ENCOUNTER — Telehealth: Payer: Self-pay | Admitting: *Deleted

## 2016-08-27 LAB — COMPREHENSIVE METABOLIC PANEL
A/G RATIO: 1.9 (ref 1.2–2.2)
ALK PHOS: 43 IU/L (ref 39–117)
ALT: 18 IU/L (ref 0–44)
AST: 21 IU/L (ref 0–40)
Albumin: 4.1 g/dL (ref 3.6–4.8)
BUN/Creatinine Ratio: 8 — ABNORMAL LOW (ref 10–24)
BUN: 8 mg/dL (ref 8–27)
Bilirubin Total: 0.4 mg/dL (ref 0.0–1.2)
CHLORIDE: 102 mmol/L (ref 96–106)
CO2: 24 mmol/L (ref 20–29)
CREATININE: 1.04 mg/dL (ref 0.76–1.27)
Calcium: 9.2 mg/dL (ref 8.6–10.2)
GFR calc Af Amer: 89 mL/min/{1.73_m2} (ref >59)
GFR calc non Af Amer: 77 mL/min/{1.73_m2} (ref >59)
GLOBULIN, TOTAL: 2.2 g/dL (ref 1.5–4.5)
Glucose: 178 mg/dL — ABNORMAL HIGH (ref 65–99)
POTASSIUM: 4.6 mmol/L (ref 3.5–5.2)
SODIUM: 142 mmol/L (ref 134–144)
Total Protein: 6.3 g/dL (ref 6.0–8.5)

## 2016-08-27 LAB — CBC WITH DIFFERENTIAL/PLATELET
BASOS ABS: 0 10*3/uL (ref 0.0–0.2)
Basos: 0 %
EOS (ABSOLUTE): 0.1 10*3/uL (ref 0.0–0.4)
Eos: 1 %
Hematocrit: 44.9 % (ref 37.5–51.0)
Hemoglobin: 14.8 g/dL (ref 13.0–17.7)
IMMATURE GRANULOCYTES: 0 %
Immature Grans (Abs): 0 10*3/uL (ref 0.0–0.1)
Lymphocytes Absolute: 0.9 10*3/uL (ref 0.7–3.1)
Lymphs: 11 %
MCH: 30.8 pg (ref 26.6–33.0)
MCHC: 33 g/dL (ref 31.5–35.7)
MCV: 93 fL (ref 79–97)
MONOS ABS: 0.4 10*3/uL (ref 0.1–0.9)
Monocytes: 6 %
NEUTROS PCT: 82 %
Neutrophils Absolute: 6.7 10*3/uL (ref 1.4–7.0)
PLATELETS: 210 10*3/uL (ref 150–379)
RBC: 4.81 x10E6/uL (ref 4.14–5.80)
RDW: 14.9 % (ref 12.3–15.4)
WBC: 8.1 10*3/uL (ref 3.4–10.8)

## 2016-08-27 NOTE — Telephone Encounter (Signed)
-----   Message from York Spanielharles K Willis, MD sent at 08/27/2016  7:22 AM EDT -----   The blood work results are unremarkable. Please call the patient.  ----- Message ----- From: Nell RangeInterface, [facility] Lab Results In Sent: 08/27/2016   5:40 AM To: York Spanielharles K Willis, MD

## 2016-08-27 NOTE — Telephone Encounter (Signed)
Called and spoke with patient about unremarkable labs per Dr Anne HahnWillis. Pt verbalized understanding and has no further questions at this time.

## 2016-10-02 ENCOUNTER — Telehealth: Payer: Self-pay | Admitting: *Deleted

## 2016-10-02 NOTE — Telephone Encounter (Signed)
Received [facility] from [facility] that rx mycophenolate shipped to pt on 09/24/16.

## 2016-11-28 ENCOUNTER — Ambulatory Visit (INDEPENDENT_AMBULATORY_CARE_PROVIDER_SITE_OTHER): Payer: BC Managed Care – PPO | Admitting: Neurology

## 2016-11-28 ENCOUNTER — Encounter: Payer: Self-pay | Admitting: Neurology

## 2016-11-28 VITALS — BP 138/87 | HR 94 | Ht 72.0 in | Wt 304.0 lb

## 2016-11-28 DIAGNOSIS — G7000 Myasthenia gravis without (acute) exacerbation: Secondary | ICD-10-CM | POA: Diagnosis not present

## 2016-11-28 DIAGNOSIS — Z5181 Encounter for therapeutic drug level monitoring: Secondary | ICD-10-CM | POA: Diagnosis not present

## 2016-11-28 MED ORDER — PYRIDOSTIGMINE BROMIDE 60 MG PO TABS: ORAL_TABLET | ORAL | 3 refills | Status: DC

## 2016-11-28 MED ORDER — MYCOPHENOLATE MOFETIL 500 MG PO TABS: ORAL_TABLET | ORAL | 11 refills | Status: DC

## 2016-11-28 NOTE — Patient Instructions (Signed)
   Consider Solaris for treatment.

## 2016-11-28 NOTE — Progress Notes (Signed)
Reason for visit: Myasthenia gravis  Randall Phillips is an 62 y.o. male  History of present illness:  Randall Phillips is a 61 year old right-handed white male with a history of myasthenia gravis. The patient also has diabetes, he does not check his blood sugars regularly in this regard. He has been able to reduce his prednisone dose to about 15 mg daily, he remains on Mestinon and CellCept. He is doing relatively well, he still has some fatigue with the jaw with chewing, he denies problems with swallowing or with speech. He has chronic issues with right deltoid weakness. He has not had weakness elsewhere. He returns to the office today for an evaluation. Overall, he feels that he is doing better with his physical abilities than he had in the past.   Past Medical History:  Diagnosis Date  . Diabetes mellitus without complication (HCC)    diet controlled  . History of renal calculi   . Hx of renal calculi   . Myasthenia gravis (HCC)   . Obesity   . 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 2010  . Vision abnormalities    Left eye blind    Past Surgical History:  Procedure Laterality Date  . THYMECTOMY     Status post  . TONSILLECTOMY      Family History  Problem Relation Age of Onset  . Cancer - Lung Mother   . Diabetes Mother   . Cerebrovascular Disease Father   . Stroke Father 24  . Diabetes Father     Social history:  reports that he has never smoked. He has never used smokeless tobacco. He reports that he does not drink alcohol or use drugs.    Allergies  Allergen Reactions  . Other     All seafood    Medications:  Prior to Admission medications   Medication Sig Start Date End Date Taking? Authorizing Provider  Calcium Carbonate-Vitamin D (CALTRATE 600+D) 600-400 MG-UNIT per tablet Take 1 tablet by mouth daily.   Yes [provider]  COCONUT OIL PO Take by mouth daily.   Yes  [provider]  fish oil-omega-3 fatty acids 1000 MG capsule Take 2 g by mouth daily.   Yes [provider]  mycophenolate (CELLCEPT) 500 MG tablet TAKE 2 TABLETS BY MOUTH IN THE MORNING AND TAKE 3 TABLETS IN THE EVENING 12/20/15  Yes York Spaniel, MD  predniSONE (DELTASONE) 10 MG tablet TAKE 2 TABLETS EVERY DAY WITH BREAKFAST 08/12/16  Yes York Spaniel, MD  predniSONE (DELTASONE) 5 MG tablet Take 1.5 tablets (7.5 mg total) by mouth daily with breakfast. 05/27/16  Yes York Spaniel, MD  pyridostigmine (MESTINON) 60 MG tablet TAKE 1 TABLET BY MOUTH EVERY 3-4 HOURS AS NEEDED 04/16/16  Yes York Spaniel, MD    ROS:  Out of a complete 14 system review of symptoms, the patient complains only of the following symptoms, and all other reviewed systems are negative.  Arm weakness  Blood pressure 138/87, pulse 94, height 6' (1.829 m), weight (!) 304 lb (137.9 kg).  Physical Exam  General: The patient is alert and cooperative at the time of the examination. The patient is markedly obese.  Skin: No significant peripheral edema is noted.   Neurologic Exam  Mental status: The patient is alert and oriented x 3 at the time of the examination. The patient has apparent normal recent and remote memory, with an  apparently normal attention span and concentration ability.   Cranial nerves: Facial symmetry is present. Speech is normal, no aphasia or dysarthria is noted. Extraocular movements are full, but the patient has medial deviation of the left eye with primary gaze. Visual fields are full. With superior gaze, there is no evidence of ptosis noted.  Motor: The patient has good strength in all 4 extremities, with exception of 4/5 strength with the right deltoid. With arms elevated for 1 minute, the patient is able to hold the right arm for about 25 seconds, no fatigable weakness was seen on the left.  Sensory examination: Soft touch sensation is symmetric on the face,  arms, and legs.  Coordination: The patient has good finger-nose-finger and heel-to-shin bilaterally.  Gait and station: The patient has a normal gait. Tandem gait is normal. Romberg is negative. No drift is seen.  Reflexes: Deep tendon reflexes are symmetric.   Assessment/Plan:  1. Myasthenia gravis  2. Diabetes  The patient will undergo blood work today. We will consider the use of Solaris for this patient. If he desires he wishes to look into this form of treatment, we will initiate a prior authorization. The patient will follow-up in 6 months. A prescription was given for the Mestinon and for the CellCept.  Marlan Palau MD 11/28/2016 9:14 AM  [facility] [address] [place], [place] [address]  Phone [phone] Fax [phone]

## 2016-11-29 ENCOUNTER — Telehealth: Payer: Self-pay

## 2016-11-29 LAB — COMPREHENSIVE METABOLIC PANEL
ALK PHOS: 46 IU/L (ref 39–117)
ALT: 19 IU/L (ref 0–44)
AST: 23 IU/L (ref 0–40)
Albumin/Globulin Ratio: 1.9 (ref 1.2–2.2)
Albumin: 4.3 g/dL (ref 3.6–4.8)
BILIRUBIN TOTAL: 0.3 mg/dL (ref 0.0–1.2)
BUN/Creatinine Ratio: 12 (ref 10–24)
BUN: 12 mg/dL (ref 8–27)
CHLORIDE: 104 mmol/L (ref 96–106)
CO2: 22 mmol/L (ref 20–29)
CREATININE: 1.03 mg/dL (ref 0.76–1.27)
Calcium: 9 mg/dL (ref 8.6–10.2)
GFR calc Af Amer: 89 mL/min/{1.73_m2} (ref >59)
GFR calc non Af Amer: 77 mL/min/{1.73_m2} (ref >59)
GLOBULIN, TOTAL: 2.3 g/dL (ref 1.5–4.5)
GLUCOSE: 173 mg/dL — AB (ref 65–99)
Potassium: 4.5 mmol/L (ref 3.5–5.2)
SODIUM: 139 mmol/L (ref 134–144)
Total Protein: 6.6 g/dL (ref 6.0–8.5)

## 2016-11-29 LAB — CBC WITH DIFFERENTIAL/PLATELET
BASOS ABS: 0 10*3/uL (ref 0.0–0.2)
Basos: 0 %
EOS (ABSOLUTE): 0 10*3/uL (ref 0.0–0.4)
Eos: 0 %
Hematocrit: 46.4 % (ref 37.5–51.0)
Hemoglobin: 15.2 g/dL (ref 13.0–17.7)
Immature Grans (Abs): 0 10*3/uL (ref 0.0–0.1)
Immature Granulocytes: 0 %
LYMPHS ABS: 0.8 10*3/uL (ref 0.7–3.1)
Lymphs: 10 %
MCH: 30.7 pg (ref 26.6–33.0)
MCHC: 32.8 g/dL (ref 31.5–35.7)
MCV: 94 fL (ref 79–97)
MONOS ABS: 0.3 10*3/uL (ref 0.1–0.9)
Monocytes: 4 %
Neutrophils Absolute: 6.6 10*3/uL (ref 1.4–7.0)
Neutrophils: 86 %
Platelets: 217 10*3/uL (ref 150–379)
RBC: 4.95 x10E6/uL (ref 4.14–5.80)
RDW: 14.6 % (ref 12.3–15.4)
WBC: 7.8 10*3/uL (ref 3.4–10.8)

## 2016-11-29 NOTE — Telephone Encounter (Signed)
-----   Message from York Spaniel, MD sent at 11/29/2016  7:29 AM EDT -----  The blood work results are unremarkable with exception that the blood glucose was 173. Please call the patient. ----- Message ----- From: Nell Range Lab Results In Sent: 11/29/2016   5:41 AM To: York Spaniel, MD

## 2016-11-29 NOTE — Telephone Encounter (Signed)
I called pt, advised him that his blood work was unremarkable except for his blood glucose of 173.  Pt verbalized understanding of results. Pt had no questions at this time but was encouraged to call back if questions arise.

## 2016-12-02 ENCOUNTER — Telehealth: Payer: Self-pay | Admitting: *Deleted

## 2016-12-02 NOTE — Telephone Encounter (Signed)
[facility] that [facility] received enrollment for rx mycophenolate mofetil. They will contact our office within 48 hr to provide status of benefit verification.  Phone: [phone], fax: [phone].

## 2016-12-16 ENCOUNTER — Other Ambulatory Visit: Payer: Self-pay | Admitting: Neurology

## 2017-01-01 ENCOUNTER — Telehealth: Payer: Self-pay | Admitting: *Deleted

## 2017-01-01 NOTE — Telephone Encounter (Signed)
[facility] that rx mycophenolate 500mg  tab shipped to pt 12/27/16.

## 2017-03-07 ENCOUNTER — Telehealth: Payer: Self-pay | Admitting: Neurology

## 2017-03-07 DIAGNOSIS — Z5181 Encounter for therapeutic drug level monitoring: Secondary | ICD-10-CM

## 2017-03-07 NOTE — Telephone Encounter (Signed)
I called the patient.  He is to come in next week to get blood work done, he is on CellCept.

## 2017-03-13 ENCOUNTER — Other Ambulatory Visit (INDEPENDENT_AMBULATORY_CARE_PROVIDER_SITE_OTHER): Payer: Self-pay

## 2017-03-13 DIAGNOSIS — Z0289 Encounter for other administrative examinations: Secondary | ICD-10-CM

## 2017-03-13 DIAGNOSIS — Z5181 Encounter for therapeutic drug level monitoring: Secondary | ICD-10-CM

## 2017-03-14 LAB — CBC WITH DIFFERENTIAL/PLATELET
BASOS: 1 %
Basophils Absolute: 0.1 10*3/uL (ref 0.0–0.2)
EOS (ABSOLUTE): 0.1 10*3/uL (ref 0.0–0.4)
EOS: 2 %
HEMATOCRIT: 45.7 % (ref 37.5–51.0)
Hemoglobin: 15.7 g/dL (ref 13.0–17.7)
Immature Grans (Abs): 0 10*3/uL (ref 0.0–0.1)
Immature Granulocytes: 0 %
LYMPHS ABS: 1.6 10*3/uL (ref 0.7–3.1)
Lymphs: 26 %
MCH: 30.8 pg (ref 26.6–33.0)
MCHC: 34.4 g/dL (ref 31.5–35.7)
MCV: 90 fL (ref 79–97)
MONOS ABS: 0.8 10*3/uL (ref 0.1–0.9)
Monocytes: 13 %
Neutrophils Absolute: 3.5 10*3/uL (ref 1.4–7.0)
Neutrophils: 58 %
Platelets: 210 10*3/uL (ref 150–379)
RBC: 5.1 x10E6/uL (ref 4.14–5.80)
RDW: 14.1 % (ref 12.3–15.4)
WBC: 6.1 10*3/uL (ref 3.4–10.8)

## 2017-03-14 LAB — COMPREHENSIVE METABOLIC PANEL
A/G RATIO: 1.8 (ref 1.2–2.2)
ALK PHOS: 48 IU/L (ref 39–117)
ALT: 17 IU/L (ref 0–44)
AST: 16 IU/L (ref 0–40)
Albumin: 4.2 g/dL (ref 3.6–4.8)
BILIRUBIN TOTAL: 0.3 mg/dL (ref 0.0–1.2)
BUN/Creatinine Ratio: 12 (ref 10–24)
BUN: 12 mg/dL (ref 8–27)
CHLORIDE: 105 mmol/L (ref 96–106)
CO2: 19 mmol/L — ABNORMAL LOW (ref 20–29)
Calcium: 9 mg/dL (ref 8.6–10.2)
Creatinine, Ser: 1.04 mg/dL (ref 0.76–1.27)
GFR calc Af Amer: 88 mL/min/{1.73_m2} (ref >59)
GFR, EST NON AFRICAN AMERICAN: 76 mL/min/{1.73_m2} (ref >59)
Globulin, Total: 2.3 g/dL (ref 1.5–4.5)
Glucose: 95 mg/dL (ref 65–99)
POTASSIUM: 4.1 mmol/L (ref 3.5–5.2)
Sodium: 141 mmol/L (ref 134–144)
Total Protein: 6.5 g/dL (ref 6.0–8.5)

## 2017-03-17 ENCOUNTER — Telehealth: Payer: Self-pay

## 2017-03-17 NOTE — Telephone Encounter (Signed)
I spoke with patient and reviewed results. He voiced understanding and mentioned that he had lost about 25 lbs and wanted to know if his sugar had gone down so I made him aware that it was at 95 and to keep up the good work. He voiced appreciation.

## 2017-03-17 NOTE — Telephone Encounter (Signed)
Copied Dr. Anne HahnWillis' result notes, "Blood work is unremarkable with exception of a minimally low CO2 level, not clinically significant. Please call the patient."

## 2017-03-27 ENCOUNTER — Other Ambulatory Visit: Payer: Self-pay | Admitting: *Deleted

## 2017-03-27 MED ORDER — PYRIDOSTIGMINE BROMIDE 60 MG PO TABS: ORAL_TABLET | ORAL | 0 refills | Status: DC

## 2017-05-28 ENCOUNTER — Encounter: Payer: Self-pay | Admitting: Neurology

## 2017-05-28 ENCOUNTER — Ambulatory Visit: Payer: BC Managed Care – PPO | Admitting: Neurology

## 2017-05-28 VITALS — BP 141/88 | HR 91 | Ht 72.0 in | Wt 276.0 lb

## 2017-05-28 DIAGNOSIS — Z5181 Encounter for therapeutic drug level monitoring: Secondary | ICD-10-CM | POA: Diagnosis not present

## 2017-05-28 DIAGNOSIS — G7000 Myasthenia gravis without (acute) exacerbation: Secondary | ICD-10-CM

## 2017-05-28 MED ORDER — PREDNISONE 10 MG PO TABS: ORAL_TABLET | ORAL | 5 refills | Status: DC

## 2017-05-28 NOTE — Progress Notes (Signed)
Reason for visit: Myasthenia gravis  Randall Phillips is an 62 y.o. male  History of present illness:  Randall Phillips is a 62 year old right-handed white male with a history of myasthenia gravis with problems with weakness with chewing and eye closure.  The patient has persistent weakness of the right deltoid muscle, he claims it is related to a rotator cuff tear that occurred several years ago.  The patient has remained stable, he has had been able to reduce the prednisone to 10 mg daily.  He has gone on a low carbohydrate diet, he has lost about 30 pounds since last seen.  The patient overall is doing well, no new issues have come up.  He denies any problems with choking with swallowing.  His speech is well maintained, he talks on the phone all day long with his job without difficulty.  Past Medical History:  Diagnosis Date  . Diabetes mellitus without complication (HCC)    diet controlled  . History of renal calculi   . Hx of renal calculi   . Myasthenia gravis (HCC)   . Obesity   . 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 2010  . Vision abnormalities    Left eye blind    Past Surgical History:  Procedure Laterality Date  . THYMECTOMY     Status post  . TONSILLECTOMY      Family History  Problem Relation Age of Onset  . Cancer - Lung Mother   . Diabetes Mother   . Cerebrovascular Disease Father   . Stroke Father 28  . Diabetes Father     Social history:  reports that  has never smoked. he has never used smokeless tobacco. He reports that he does not drink alcohol or use drugs.    Allergies  Allergen Reactions  . Other     All seafood    Medications:  Prior to Admission medications   Medication Sig Start Date End Date Taking? Authorizing Provider  Calcium Carbonate-Vitamin D (CALTRATE 600+D) 600-400 MG-UNIT per tablet Take 1 tablet by mouth daily.   Yes [provider]    COCONUT OIL PO Take by mouth daily.   Yes [provider]  fish oil-omega-3 fatty acids 1000 MG capsule Take 2 g by mouth daily.   Yes [provider]  mycophenolate (CELLCEPT) 500 MG tablet TAKE 2 TABLETS BY MOUTH IN THE MORNING AND TAKE 3 TABLETS IN THE EVENING AS DIRECTED. 12/16/16  Yes York Spaniel, MD  predniSONE (DELTASONE) 10 MG tablet TAKE 2 TABLETS EVERY DAY WITH BREAKFAST Patient taking differently: Takes 1 tablet daily 08/12/16  Yes York Spaniel, MD  pyridostigmine (MESTINON) 60 MG tablet TAKE 1 TABLET BY MOUTH EVERY 3-4 HOURS AS NEEDED 03/27/17  Yes York Spaniel, MD    ROS:  Out of a complete 14 system review of symptoms, the patient complains only of the following symptoms, and all other reviewed systems are negative.  Right arm weakness  Blood pressure (!) 141/88, pulse 91, height 6' (1.829 m), weight 276 lb (125.2 kg).  Physical Exam  General: The patient is alert and cooperative at the time of the examination.  The patient is markedly obese.  Skin: 2+ edema below the knees is seen bilaterally.   Neurologic Exam  Mental status: The patient is alert and oriented x 3 at the time of the examination. The patient has apparent normal recent and remote memory,  with an apparently normal attention span and concentration ability.   Cranial nerves: Facial symmetry is present. Speech is normal, no aphasia or dysarthria is noted.  There is divergence of gaze, there is medial deviation of the left eye on primary gaze.  No ptosis is seen.  Weakness with eye closure is noted, the patient has good strength with jaw opening and closure to direct testing. Visual fields are full.  Motor: The patient has good strength in all 4 extremities, with exception of weakness of the right deltoid muscle.  Sensory examination: Soft touch sensation is symmetric on the face, arms, and legs.  Coordination: The patient has good finger-nose-finger and heel-to-shin  bilaterally.  Gait and station: The patient has a normal gait. Tandem gait is normal. Romberg is negative. No drift is seen.  Reflexes: Deep tendon reflexes are symmetric, but are depressed.   Assessment/Plan:  1.  Myasthenia gravis  The patient will continue the current dose of prednisone and CellCept.  We will check blood work today.  He will follow-up in 6 months.  We will recheck blood work in 3 months.  Randall Phillips. Randall Makensey Rego MD 05/28/2017 9:59 AM  [facility] [address] [place], [place] [address]  Phone [phone] Fax [phone]

## 2017-05-29 ENCOUNTER — Telehealth: Payer: Self-pay | Admitting: *Deleted

## 2017-05-29 LAB — CBC WITH DIFFERENTIAL/PLATELET
Basophils Absolute: 0 10*3/uL (ref 0.0–0.2)
Basos: 0 %
EOS (ABSOLUTE): 0 10*3/uL (ref 0.0–0.4)
EOS: 0 %
HEMATOCRIT: 45.6 % (ref 37.5–51.0)
Hemoglobin: 15.2 g/dL (ref 13.0–17.7)
Immature Grans (Abs): 0 10*3/uL (ref 0.0–0.1)
Immature Granulocytes: 0 %
Lymphocytes Absolute: 0.9 10*3/uL (ref 0.7–3.1)
Lymphs: 11 %
MCH: 30.3 pg (ref 26.6–33.0)
MCHC: 33.3 g/dL (ref 31.5–35.7)
MCV: 91 fL (ref 79–97)
MONOS ABS: 0.5 10*3/uL (ref 0.1–0.9)
Monocytes: 6 %
Neutrophils Absolute: 6.4 10*3/uL (ref 1.4–7.0)
Neutrophils: 83 %
PLATELETS: 231 10*3/uL (ref 150–379)
RBC: 5.01 x10E6/uL (ref 4.14–5.80)
RDW: 14.9 % (ref 12.3–15.4)
WBC: 7.7 10*3/uL (ref 3.4–10.8)

## 2017-05-29 LAB — COMPREHENSIVE METABOLIC PANEL
ALK PHOS: 48 IU/L (ref 39–117)
ALT: 15 IU/L (ref 0–44)
AST: 17 IU/L (ref 0–40)
Albumin/Globulin Ratio: 1.8 (ref 1.2–2.2)
Albumin: 4.3 g/dL (ref 3.6–4.8)
BUN/Creatinine Ratio: 13 (ref 10–24)
BUN: 12 mg/dL (ref 8–27)
Bilirubin Total: 0.5 mg/dL (ref 0.0–1.2)
CO2: 19 mmol/L — AB (ref 20–29)
CREATININE: 0.93 mg/dL (ref 0.76–1.27)
Calcium: 8.8 mg/dL (ref 8.6–10.2)
Chloride: 105 mmol/L (ref 96–106)
GFR calc Af Amer: 101 mL/min/{1.73_m2} (ref >59)
GFR calc non Af Amer: 87 mL/min/{1.73_m2} (ref >59)
GLOBULIN, TOTAL: 2.4 g/dL (ref 1.5–4.5)
GLUCOSE: 113 mg/dL — AB (ref 65–99)
Potassium: 4.1 mmol/L (ref 3.5–5.2)
SODIUM: 140 mmol/L (ref 134–144)
Total Protein: 6.7 g/dL (ref 6.0–8.5)

## 2017-05-29 NOTE — Telephone Encounter (Signed)
-----   Message from York Spanielharles K Willis, MD sent at 05/29/2017  7:24 AM EDT -----  The blood work results are unremarkable, with exception of a slightly low CO2 level, this is stable and of no clinical significance. Please call the patient. ----- Message ----- From: Nell RangeInterface, [facility] Lab Results In Sent: 05/29/2017   5:43 AM To: York Spanielharles K Willis, MD

## 2017-05-29 NOTE — Telephone Encounter (Signed)
Called and spoke with pt. Relayed results per Dr. Anne HahnWillis that results unremarkable with exception of slightly low CO2 level that is stable, no clinical significance. Pt wanted to know what his blood glucose was. I informed him it was 113. He verbalized understanding and appreciation for call.

## 2017-08-11 ENCOUNTER — Telehealth: Payer: Self-pay | Admitting: *Deleted

## 2017-08-11 NOTE — Telephone Encounter (Signed)
[facility] rx mycophenolate 500mg  tab shipped to pt 07/23/17.

## 2017-08-27 ENCOUNTER — Telehealth: Payer: Self-pay | Admitting: Neurology

## 2017-08-27 DIAGNOSIS — Z5181 Encounter for therapeutic drug level monitoring: Secondary | ICD-10-CM

## 2017-08-27 NOTE — Telephone Encounter (Signed)
I called the patient.  The patient will come in to get blood work done on the CellCept.

## 2017-08-29 ENCOUNTER — Other Ambulatory Visit (INDEPENDENT_AMBULATORY_CARE_PROVIDER_SITE_OTHER): Payer: Self-pay

## 2017-08-29 DIAGNOSIS — Z5181 Encounter for therapeutic drug level monitoring: Secondary | ICD-10-CM

## 2017-08-29 DIAGNOSIS — Z0289 Encounter for other administrative examinations: Secondary | ICD-10-CM

## 2017-08-30 LAB — CBC WITH DIFFERENTIAL/PLATELET
BASOS: 0 %
Basophils Absolute: 0 10*3/uL (ref 0.0–0.2)
EOS (ABSOLUTE): 0 10*3/uL (ref 0.0–0.4)
Eos: 0 %
Hematocrit: 47.8 % (ref 37.5–51.0)
Hemoglobin: 15.6 g/dL (ref 13.0–17.7)
Immature Grans (Abs): 0 10*3/uL (ref 0.0–0.1)
Immature Granulocytes: 0 %
Lymphocytes Absolute: 0.8 10*3/uL (ref 0.7–3.1)
Lymphs: 10 %
MCH: 30.3 pg (ref 26.6–33.0)
MCHC: 32.6 g/dL (ref 31.5–35.7)
MCV: 93 fL (ref 79–97)
MONOS ABS: 0.5 10*3/uL (ref 0.1–0.9)
Monocytes: 7 %
NEUTROS ABS: 6 10*3/uL (ref 1.4–7.0)
Neutrophils: 83 %
PLATELETS: 207 10*3/uL (ref 150–450)
RBC: 5.15 x10E6/uL (ref 4.14–5.80)
RDW: 15 % (ref 12.3–15.4)
WBC: 7.3 10*3/uL (ref 3.4–10.8)

## 2017-08-30 LAB — COMPREHENSIVE METABOLIC PANEL
A/G RATIO: 2 (ref 1.2–2.2)
ALT: 7 IU/L (ref 0–44)
AST: 12 IU/L (ref 0–40)
Albumin: 4.3 g/dL (ref 3.6–4.8)
Alkaline Phosphatase: 51 IU/L (ref 39–117)
BILIRUBIN TOTAL: 0.4 mg/dL (ref 0.0–1.2)
BUN/Creatinine Ratio: 21 (ref 10–24)
BUN: 20 mg/dL (ref 8–27)
CHLORIDE: 104 mmol/L (ref 96–106)
CO2: 22 mmol/L (ref 20–29)
Calcium: 9.6 mg/dL (ref 8.6–10.2)
Creatinine, Ser: 0.96 mg/dL (ref 0.76–1.27)
GFR calc non Af Amer: 84 mL/min/{1.73_m2} (ref >59)
GFR, EST AFRICAN AMERICAN: 97 mL/min/{1.73_m2} (ref >59)
Globulin, Total: 2.1 g/dL (ref 1.5–4.5)
Glucose: 102 mg/dL — ABNORMAL HIGH (ref 65–99)
POTASSIUM: 4.5 mmol/L (ref 3.5–5.2)
Sodium: 138 mmol/L (ref 134–144)
TOTAL PROTEIN: 6.4 g/dL (ref 6.0–8.5)

## 2017-09-01 ENCOUNTER — Telehealth: Payer: Self-pay | Admitting: *Deleted

## 2017-09-01 NOTE — Telephone Encounter (Signed)
Spoke with patient and informed him his blood work results are unremarkable. He asked what his glucose level was, advised it was . He stated he has lost weight and cut way back on his carbohydrates. This RN congratulated him. He had no further questions, verbalized understanding, appreciation .

## 2017-09-22 ENCOUNTER — Telehealth: Payer: Self-pay | Admitting: *Deleted

## 2017-09-22 NOTE — Telephone Encounter (Signed)
[facility] rx mycophenolate 500mg  tab shipped to pt 09/18/2017

## 2017-09-24 ENCOUNTER — Other Ambulatory Visit: Payer: Self-pay | Admitting: Neurology

## 2017-09-30 ENCOUNTER — Other Ambulatory Visit: Payer: Self-pay | Admitting: Neurology

## 2017-10-27 ENCOUNTER — Telehealth: Payer: Self-pay | Admitting: *Deleted

## 2017-10-27 NOTE — Telephone Encounter (Signed)
[facility] rx mycophenolate 500mg  tab shipped to pt08/14/2019.

## 2017-11-24 ENCOUNTER — Telehealth: Payer: Self-pay | Admitting: *Deleted

## 2017-11-24 NOTE — Telephone Encounter (Signed)
[facility] rx mycophenolate 500mg  tab shipped to pt09/13/2019.

## 2017-12-01 ENCOUNTER — Encounter: Payer: Self-pay | Admitting: Neurology

## 2017-12-01 ENCOUNTER — Ambulatory Visit: Payer: BC Managed Care – PPO | Admitting: Neurology

## 2017-12-01 VITALS — BP 138/80 | HR 79 | Wt 250.8 lb

## 2017-12-01 DIAGNOSIS — G7000 Myasthenia gravis without (acute) exacerbation: Secondary | ICD-10-CM

## 2017-12-01 DIAGNOSIS — Z5181 Encounter for therapeutic drug level monitoring: Secondary | ICD-10-CM | POA: Diagnosis not present

## 2017-12-01 MED ORDER — MYCOPHENOLATE MOFETIL 500 MG PO TABS: ORAL_TABLET | ORAL | 11 refills | Status: DC

## 2017-12-01 MED ORDER — PYRIDOSTIGMINE BROMIDE 60 MG PO TABS: ORAL_TABLET | ORAL | 1 refills | Status: DC

## 2017-12-01 NOTE — Progress Notes (Signed)
Reason for visit: Myasthenia gravis  Randall Phillips is an 62 y.o. male  History of present illness:  Randall Phillips is a 61 year old right-handed white male with a history of myasthenia gravis.  The patient has cut back on his prednisone taking 5 mg alternating with 10 mg every other day.  He started this about 3 months ago and he has done well.  He has been careful about his diet, he is on a low carbohydrate diet which has also helped myasthenia gravis.  The patient has lost about 60 pounds, he is controlling his diabetes better.  He continues to have problems with weakness with chewing, he swallows fairly well.  He is going for cataract surgery in November 2019.  He has some weakness of the right deltoid muscle that in part is related to prior rotator cuff problems.  The patient returns to the office today for an evaluation.  He denies any new issues with his clinical condition.  Past Medical History:  Diagnosis Date  . Diabetes mellitus without complication (HCC)    diet controlled  . History of renal calculi   . Hx of renal calculi   . Myasthenia gravis (HCC)   . Obesity   . 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 2010  . Vision abnormalities    Left eye blind    Past Surgical History:  Procedure Laterality Date  . THYMECTOMY     Status post  . TONSILLECTOMY      Family History  Problem Relation Age of Onset  . Cancer - Lung Mother   . Diabetes Mother   . Cerebrovascular Disease Father   . Stroke Father 21  . Diabetes Father     Social history:  reports that he has never smoked. He has never used smokeless tobacco. He reports that he does not drink alcohol or use drugs.    Allergies  Allergen Reactions  . Other     All seafood    Medications:  Prior to Admission medications   Medication Sig Start Date End Date Taking? Authorizing Provider  Calcium Carbonate-Vitamin D (CALTRATE  600+D) 600-400 MG-UNIT per tablet Take 1 tablet by mouth daily.   Yes [provider]  COCONUT OIL PO Take by mouth daily.   Yes [provider]  fish oil-omega-3 fatty acids 1000 MG capsule Take 2 g by mouth daily.   Yes [provider]  Menaquinone-7 (VITAMIN K2 PO) Take 100 mcg by mouth daily.   Yes [provider]  mycophenolate (CELLCEPT) 500 MG tablet TAKE 2 TABLETS BY MOUTH IN THE MORNING AND TAKE 3 TABLETS IN THE EVENING AS DIRECTED. 12/01/17  Yes York Spaniel, MD  predniSONE (DELTASONE) 10 MG tablet Takes 1 tablet daily Patient taking differently: Take 10 mg by mouth every other day. Takes 1 tablet every other day. Takes the 5mg  tablet on the other days. 09/30/17  Yes York Spaniel, MD  predniSONE (DELTASONE) 5 MG tablet Take 5 mg by mouth every other day. Takes every other day. Takes 10mg  tablet on other days.   Yes [provider]  pyridostigmine (MESTINON) 60 MG tablet TAKE 1 TABLET BY MOUTH EVERY 3-4 HOURS AS NEEDED 12/01/17  Yes York Spaniel, MD    ROS:  Out of a complete 14 system review of symptoms, the patient complains only of the following symptoms, and all other reviewed systems are negative.  Weakness  Blood pressure 138/80, pulse 79, weight 250 lb 12.8 oz (113.8 kg), SpO2 95 %.  Physical Exam  General: The patient is alert and cooperative at the time of the examination.  The patient is markedly obese.  Skin: No significant peripheral edema is noted.   Neurologic Exam  Mental status: The patient is alert and oriented x 3 at the time of the examination. The patient has apparent normal recent and remote memory, with an apparently normal attention span and concentration ability.   Cranial nerves: Facial symmetry is present. Speech is normal, no aphasia or dysarthria is noted. Extraocular movements are full. Visual fields are full.  Weakness with eye closure is noted, no weakness with jaw opening or closure is  noted.  Motor: The patient has good strength in all 4 extremities, with exception of slight weakness of the right deltoid muscle.  Sensory examination: Soft touch sensation is symmetric on the face, arms, and legs.  Coordination: The patient has good finger-nose-finger and heel-to-shin bilaterally.  Gait and station: The patient has a normal gait. Tandem gait is slightly unsteady. Romberg is negative. No drift is seen.  Reflexes: Deep tendon reflexes are symmetric.   Assessment/Plan:  1.  Myasthenia gravis  The patient will be going for cataract surgery in the near future.  He will remain on his current dose of prednisone, he has been able to cut back and do well with this.  A prescription was given for the Mestinon and for the CellCept.  He will have blood work done today.  He will follow-up in 6 months.  Marlan Palau. Keith Willis MD 12/01/2017 10:16 AM  [facility] [address] [place], [place] [address]  Phone [phone] Fax [phone]

## 2017-12-02 ENCOUNTER — Telehealth: Payer: Self-pay | Admitting: *Deleted

## 2017-12-02 LAB — CBC WITH DIFFERENTIAL/PLATELET
Basophils Absolute: 0 10*3/uL (ref 0.0–0.2)
Basos: 1 %
EOS (ABSOLUTE): 0 10*3/uL (ref 0.0–0.4)
EOS: 0 %
HEMOGLOBIN: 15.3 g/dL (ref 13.0–17.7)
Hematocrit: 46.3 % (ref 37.5–51.0)
IMMATURE GRANS (ABS): 0 10*3/uL (ref 0.0–0.1)
IMMATURE GRANULOCYTES: 0 %
LYMPHS: 12 %
Lymphocytes Absolute: 0.8 10*3/uL (ref 0.7–3.1)
MCH: 30.4 pg (ref 26.6–33.0)
MCHC: 33 g/dL (ref 31.5–35.7)
MCV: 92 fL (ref 79–97)
MONOS ABS: 0.5 10*3/uL (ref 0.1–0.9)
Monocytes: 8 %
NEUTROS PCT: 79 %
Neutrophils Absolute: 5 10*3/uL (ref 1.4–7.0)
Platelets: 207 10*3/uL (ref 150–450)
RBC: 5.03 x10E6/uL (ref 4.14–5.80)
RDW: 13.2 % (ref 12.3–15.4)
WBC: 6.4 10*3/uL (ref 3.4–10.8)

## 2017-12-02 LAB — COMPREHENSIVE METABOLIC PANEL
ALBUMIN: 4.4 g/dL (ref 3.6–4.8)
ALT: 6 IU/L (ref 0–44)
AST: 12 IU/L (ref 0–40)
Albumin/Globulin Ratio: 2.2 (ref 1.2–2.2)
Alkaline Phosphatase: 51 IU/L (ref 39–117)
BUN/Creatinine Ratio: 13 (ref 10–24)
BUN: 14 mg/dL (ref 8–27)
Bilirubin Total: 0.7 mg/dL (ref 0.0–1.2)
CALCIUM: 9.3 mg/dL (ref 8.6–10.2)
CO2: 24 mmol/L (ref 20–29)
Chloride: 104 mmol/L (ref 96–106)
Creatinine, Ser: 1.04 mg/dL (ref 0.76–1.27)
GFR calc Af Amer: 87 mL/min/{1.73_m2} (ref >59)
GFR calc non Af Amer: 76 mL/min/{1.73_m2} (ref >59)
GLOBULIN, TOTAL: 2 g/dL (ref 1.5–4.5)
Glucose: 114 mg/dL — ABNORMAL HIGH (ref 65–99)
Potassium: 4.2 mmol/L (ref 3.5–5.2)
SODIUM: 141 mmol/L (ref 134–144)
Total Protein: 6.4 g/dL (ref 6.0–8.5)

## 2017-12-02 NOTE — Telephone Encounter (Signed)
-----   Message from York Spanielharles K Willis, MD sent at 12/02/2017  7:19 AM EDT ----- Please call the patient and let him know that the blood work is OK. Thanks.

## 2017-12-02 NOTE — Telephone Encounter (Signed)
Called and spoke with pt about lab results per Dr. Anne HahnWillis note. Pt verbalized understanding.  He asked what glucose level was. Informed him it was 114.

## 2017-12-19 ENCOUNTER — Other Ambulatory Visit: Payer: Self-pay | Admitting: Neurology

## 2018-02-27 ENCOUNTER — Telehealth: Payer: Self-pay | Admitting: Neurology

## 2018-02-27 DIAGNOSIS — Z5181 Encounter for therapeutic drug level monitoring: Secondary | ICD-10-CM

## 2018-02-27 NOTE — Telephone Encounter (Signed)
I called the patient. He is to come in at some time and get blood work to monitor the Cellcept. He just had cataract surgery, he will likely not be in until January.

## 2018-03-30 ENCOUNTER — Other Ambulatory Visit (INDEPENDENT_AMBULATORY_CARE_PROVIDER_SITE_OTHER): Payer: Self-pay

## 2018-03-30 ENCOUNTER — Telehealth: Payer: Self-pay

## 2018-03-30 DIAGNOSIS — Z5181 Encounter for therapeutic drug level monitoring: Secondary | ICD-10-CM

## 2018-03-30 DIAGNOSIS — Z0289 Encounter for other administrative examinations: Secondary | ICD-10-CM

## 2018-03-30 NOTE — Telephone Encounter (Signed)
[facility] stating [facility] has been shipped to the pt. Shipping date 03/23/18.

## 2018-03-31 ENCOUNTER — Telehealth: Payer: Self-pay | Admitting: *Deleted

## 2018-03-31 LAB — CBC WITH DIFFERENTIAL/PLATELET
BASOS: 1 %
Basophils Absolute: 0.1 10*3/uL (ref 0.0–0.2)
EOS (ABSOLUTE): 0 10*3/uL (ref 0.0–0.4)
Eos: 1 %
Hematocrit: 45.4 % (ref 37.5–51.0)
Hemoglobin: 15.4 g/dL (ref 13.0–17.7)
Immature Grans (Abs): 0 10*3/uL (ref 0.0–0.1)
Immature Granulocytes: 0 %
Lymphocytes Absolute: 0.8 10*3/uL (ref 0.7–3.1)
Lymphs: 10 %
MCH: 30.9 pg (ref 26.6–33.0)
MCHC: 33.9 g/dL (ref 31.5–35.7)
MCV: 91 fL (ref 79–97)
Monocytes Absolute: 0.4 10*3/uL (ref 0.1–0.9)
Monocytes: 5 %
NEUTROS ABS: 6.4 10*3/uL (ref 1.4–7.0)
Neutrophils: 83 %
Platelets: 225 10*3/uL (ref 150–450)
RBC: 4.98 x10E6/uL (ref 4.14–5.80)
RDW: 13.6 % (ref 11.6–15.4)
WBC: 7.7 10*3/uL (ref 3.4–10.8)

## 2018-03-31 LAB — COMPREHENSIVE METABOLIC PANEL
ALT: 11 IU/L (ref 0–44)
AST: 14 IU/L (ref 0–40)
Albumin/Globulin Ratio: 1.6 (ref 1.2–2.2)
Albumin: 4.1 g/dL (ref 3.8–4.8)
Alkaline Phosphatase: 56 IU/L (ref 39–117)
BUN/Creatinine Ratio: 13 (ref 10–24)
BUN: 13 mg/dL (ref 8–27)
Bilirubin Total: 0.6 mg/dL (ref 0.0–1.2)
CO2: 22 mmol/L (ref 20–29)
Calcium: 9.2 mg/dL (ref 8.6–10.2)
Chloride: 106 mmol/L (ref 96–106)
Creatinine, Ser: 1.04 mg/dL (ref 0.76–1.27)
GFR calc non Af Amer: 76 mL/min/{1.73_m2} (ref >59)
GFR, EST AFRICAN AMERICAN: 87 mL/min/{1.73_m2} (ref >59)
GLOBULIN, TOTAL: 2.5 g/dL (ref 1.5–4.5)
Glucose: 113 mg/dL — ABNORMAL HIGH (ref 65–99)
Potassium: 4.2 mmol/L (ref 3.5–5.2)
SODIUM: 143 mmol/L (ref 134–144)
Total Protein: 6.6 g/dL (ref 6.0–8.5)

## 2018-03-31 NOTE — Telephone Encounter (Signed)
-----   Message from York Spaniel, MD sent at 03/31/2018  7:23 AM EST -----  The blood work results are unremarkable. Please call the patient. ----- Message ----- From: Nell Range Lab Results In Sent: 03/31/2018   5:39 AM EST To: York Spaniel, MD

## 2018-03-31 NOTE — Telephone Encounter (Signed)
Spoke with Randall Phillips and reviewed below lab results. He verbalized understanding of same/fim

## 2018-05-26 ENCOUNTER — Telehealth: Payer: Self-pay

## 2018-05-26 NOTE — Telephone Encounter (Signed)
Received fax from [facility] stating cellcept was shipped on 05/19/18 to the patient.

## 2018-06-22 ENCOUNTER — Ambulatory Visit: Payer: BC Managed Care – PPO | Admitting: Neurology

## 2018-06-22 ENCOUNTER — Telehealth: Payer: Self-pay

## 2018-06-22 NOTE — Telephone Encounter (Signed)
I contacted the pt in regards to his 06/24/18 6 month f/u. Pt was advised, due to current COVID 19 pandemic, our office is severely reducing in office visits for at least the next 2 weeks, in order to minimize the risk to our patients and healthcare providers.   Pt was offered a virtual visit with Dr. Anne Hahn on 06/24/18 at 1:30 and accepted.   Pt understands that although there may be some limitations with this type of visit, we will take all precautions to reduce any security or privacy concerns.  Pt understands that this will be treated like an in office visit and we will file with pt's insurance, and there may be a patient responsible charge related to this service.  Pt's email is [email or web address]. Pt understands that the cisco webex software must be downloaded and operational on the device pt plans to use for the visit.  Pre charting has been completed for this visit.

## 2018-06-24 ENCOUNTER — Ambulatory Visit (INDEPENDENT_AMBULATORY_CARE_PROVIDER_SITE_OTHER): Payer: BC Managed Care – PPO | Admitting: Neurology

## 2018-06-24 ENCOUNTER — Ambulatory Visit: Payer: BC Managed Care – PPO | Admitting: Neurology

## 2018-06-24 ENCOUNTER — Encounter: Payer: Self-pay | Admitting: Neurology

## 2018-06-24 ENCOUNTER — Other Ambulatory Visit: Payer: Self-pay

## 2018-06-24 DIAGNOSIS — Z5181 Encounter for therapeutic drug level monitoring: Secondary | ICD-10-CM | POA: Diagnosis not present

## 2018-06-24 DIAGNOSIS — G7000 Myasthenia gravis without (acute) exacerbation: Secondary | ICD-10-CM | POA: Diagnosis not present

## 2018-06-24 MED ORDER — PREDNISONE 2.5 MG PO TABS: ORAL_TABLET | ORAL | 2 refills | Status: DC

## 2018-06-24 NOTE — Progress Notes (Signed)
     Virtual Visit via Video Note  I connected with Randall Phillips on 06/24/18 at  1:30 PM EDT by a video enabled telemedicine application and verified that I am speaking with the correct person using two identifiers.   I discussed the limitations of evaluation and management by telemedicine and the availability of in person appointments. The patient expressed understanding and agreed to proceed.  History of Present Illness: Randall Phillips is a 63 year old right-handed white male with a history of myasthenia gravis.  The patient is doing quite well on his current drug regimen.  He remains on CellCept and Mestinon, he is tapering his prednisone slowly, he currently is on 10 mg every other day of prednisone and he has not had any increased weakness on the lower dose.  He is working from home currently, he recently did have cataract surgery.  He has strabismus that is a lifelong problem, he does not see double therefore.  He continues to have some weakness of the muscles of mastication when he eats, he denies any significant issues with swallowing.  Occasionally he may have alteration in his speech if he misses his Mestinon doses.  He has a right rotator cuff tear problem and he is not able to elevate the right arm in part because of pain.  He denies any other weakness of the extremities.   Observations/Objective: WebEx evaluation shows that the patient has medial deviation of the left eye on primary gaze and with superior gaze.  No ptosis is seen.  Speech is well enunciated, not aphasic or dysarthric.  With arm outstretched on the left for 1 minute, no fatigable weakness was seen.  The patient has a symmetric smile, he was able to protrude the tongue in the midline with good lateral movement of the tongue.  Assessment and Plan: 1.  Myasthenia gravis  The patient is tapering off of the prednisone slowly, I will send in a prescription for the 2.5 mg tablets to take 7.5 mg every other day for 2 months  and then try to go to 5 mg every other day.  He will have blood work done for his CellCept.  He will follow-up in 6 months.  A prescription was sent in for the 2.5 mg tablets of prednisone.  Follow Up Instructions: 54-month follow-up with me.   I discussed the assessment and treatment plan with the patient. The patient was provided an opportunity to ask questions and all were answered. The patient agreed with the plan and demonstrated an understanding of the instructions.   The patient was advised to call back or seek an in-person evaluation if the symptoms worsen or if the condition fails to improve as anticipated.  I provided 15 minutes of non-face-to-face time during this encounter.   York Spaniel, MD

## 2018-06-30 ENCOUNTER — Other Ambulatory Visit (INDEPENDENT_AMBULATORY_CARE_PROVIDER_SITE_OTHER): Payer: Self-pay

## 2018-06-30 ENCOUNTER — Other Ambulatory Visit: Payer: Self-pay

## 2018-06-30 DIAGNOSIS — Z5181 Encounter for therapeutic drug level monitoring: Secondary | ICD-10-CM

## 2018-06-30 DIAGNOSIS — Z0289 Encounter for other administrative examinations: Secondary | ICD-10-CM

## 2018-07-01 ENCOUNTER — Telehealth: Payer: Self-pay

## 2018-07-01 LAB — CBC WITH DIFFERENTIAL/PLATELET
Basophils Absolute: 0.1 10*3/uL (ref 0.0–0.2)
Basos: 1 %
EOS (ABSOLUTE): 0.1 10*3/uL (ref 0.0–0.4)
Eos: 1 %
Hematocrit: 46.5 % (ref 37.5–51.0)
Hemoglobin: 15.4 g/dL (ref 13.0–17.7)
Immature Grans (Abs): 0 10*3/uL (ref 0.0–0.1)
Immature Granulocytes: 0 %
Lymphocytes Absolute: 1.1 10*3/uL (ref 0.7–3.1)
Lymphs: 17 %
MCH: 30.6 pg (ref 26.6–33.0)
MCHC: 33.1 g/dL (ref 31.5–35.7)
MCV: 92 fL (ref 79–97)
Monocytes Absolute: 0.6 10*3/uL (ref 0.1–0.9)
Monocytes: 9 %
Neutrophils Absolute: 4.8 10*3/uL (ref 1.4–7.0)
Neutrophils: 72 %
Platelets: 232 10*3/uL (ref 150–450)
RBC: 5.03 x10E6/uL (ref 4.14–5.80)
RDW: 13.6 % (ref 11.6–15.4)
WBC: 6.6 10*3/uL (ref 3.4–10.8)

## 2018-07-01 LAB — COMPREHENSIVE METABOLIC PANEL
ALT: 21 IU/L (ref 0–44)
AST: 17 IU/L (ref 0–40)
Albumin/Globulin Ratio: 1.9 (ref 1.2–2.2)
Albumin: 4.2 g/dL (ref 3.8–4.8)
Alkaline Phosphatase: 58 IU/L (ref 39–117)
BUN/Creatinine Ratio: 13 (ref 10–24)
BUN: 12 mg/dL (ref 8–27)
Bilirubin Total: 0.4 mg/dL (ref 0.0–1.2)
CO2: 22 mmol/L (ref 20–29)
Calcium: 9.3 mg/dL (ref 8.6–10.2)
Chloride: 106 mmol/L (ref 96–106)
Creatinine, Ser: 0.96 mg/dL (ref 0.76–1.27)
GFR calc Af Amer: 96 mL/min/{1.73_m2} (ref >59)
GFR calc non Af Amer: 83 mL/min/{1.73_m2} (ref >59)
Globulin, Total: 2.2 g/dL (ref 1.5–4.5)
Glucose: 124 mg/dL — ABNORMAL HIGH (ref 65–99)
Potassium: 4.4 mmol/L (ref 3.5–5.2)
Sodium: 143 mmol/L (ref 134–144)
Total Protein: 6.4 g/dL (ref 6.0–8.5)

## 2018-07-01 NOTE — Telephone Encounter (Signed)
Pt notified of unremarkable labs and verbalized understanding and appreciation for the call.

## 2018-07-01 NOTE — Telephone Encounter (Signed)
-----   Message from York Spaniel, MD sent at 07/01/2018  7:14 AM EDT -----  The blood work results are unremarkable. Please call the patient. ----- Message ----- From: Nell Range Lab Results In Sent: 07/01/2018   5:36 AM EDT To: York Spaniel, MD

## 2018-07-28 ENCOUNTER — Telehealth: Payer: Self-pay

## 2018-07-28 NOTE — Telephone Encounter (Signed)
[facility] stating CellCept was shipped to the pt on 07/20/2018.

## 2018-08-24 ENCOUNTER — Telehealth: Payer: Self-pay

## 2018-08-24 NOTE — Telephone Encounter (Signed)
[facility] from [facility] stating CellCept was shipped to the pt on 08/17/2018.

## 2018-09-23 ENCOUNTER — Telehealth: Payer: Self-pay | Admitting: Neurology

## 2018-09-23 DIAGNOSIS — Z5181 Encounter for therapeutic drug level monitoring: Secondary | ICD-10-CM

## 2018-09-23 NOTE — Telephone Encounter (Signed)
I called the patient.  He will come in to get blood work, he is on CellCept.

## 2018-09-24 ENCOUNTER — Other Ambulatory Visit (INDEPENDENT_AMBULATORY_CARE_PROVIDER_SITE_OTHER): Payer: Self-pay

## 2018-09-24 ENCOUNTER — Other Ambulatory Visit: Payer: Self-pay

## 2018-09-24 DIAGNOSIS — Z0289 Encounter for other administrative examinations: Secondary | ICD-10-CM

## 2018-09-24 DIAGNOSIS — Z5181 Encounter for therapeutic drug level monitoring: Secondary | ICD-10-CM

## 2018-09-25 LAB — CBC WITH DIFFERENTIAL/PLATELET
Basophils Absolute: 0.1 10*3/uL (ref 0.0–0.2)
Basos: 1 %
EOS (ABSOLUTE): 0 10*3/uL (ref 0.0–0.4)
Eos: 1 %
Hematocrit: 45.2 % (ref 37.5–51.0)
Hemoglobin: 15.4 g/dL (ref 13.0–17.7)
Immature Grans (Abs): 0 10*3/uL (ref 0.0–0.1)
Immature Granulocytes: 0 %
Lymphocytes Absolute: 0.9 10*3/uL (ref 0.7–3.1)
Lymphs: 13 %
MCH: 30.5 pg (ref 26.6–33.0)
MCHC: 34.1 g/dL (ref 31.5–35.7)
MCV: 90 fL (ref 79–97)
Monocytes Absolute: 0.5 10*3/uL (ref 0.1–0.9)
Monocytes: 7 %
Neutrophils Absolute: 5.6 10*3/uL (ref 1.4–7.0)
Neutrophils: 78 %
Platelets: 232 10*3/uL (ref 150–450)
RBC: 5.05 x10E6/uL (ref 4.14–5.80)
RDW: 13.6 % (ref 11.6–15.4)
WBC: 7.1 10*3/uL (ref 3.4–10.8)

## 2018-09-25 LAB — COMPREHENSIVE METABOLIC PANEL
ALT: 11 IU/L (ref 0–44)
AST: 18 IU/L (ref 0–40)
Albumin/Globulin Ratio: 2 (ref 1.2–2.2)
Albumin: 4.3 g/dL (ref 3.8–4.8)
Alkaline Phosphatase: 58 IU/L (ref 39–117)
BUN/Creatinine Ratio: 9 — ABNORMAL LOW (ref 10–24)
BUN: 9 mg/dL (ref 8–27)
Bilirubin Total: 0.4 mg/dL (ref 0.0–1.2)
CO2: 20 mmol/L (ref 20–29)
Calcium: 9 mg/dL (ref 8.6–10.2)
Chloride: 107 mmol/L — ABNORMAL HIGH (ref 96–106)
Creatinine, Ser: 0.99 mg/dL (ref 0.76–1.27)
GFR calc Af Amer: 93 mL/min/{1.73_m2} (ref >59)
GFR calc non Af Amer: 80 mL/min/{1.73_m2} (ref >59)
Globulin, Total: 2.1 g/dL (ref 1.5–4.5)
Glucose: 127 mg/dL — ABNORMAL HIGH (ref 65–99)
Potassium: 4.3 mmol/L (ref 3.5–5.2)
Sodium: 142 mmol/L (ref 134–144)
Total Protein: 6.4 g/dL (ref 6.0–8.5)

## 2018-09-29 ENCOUNTER — Telehealth: Payer: Self-pay

## 2018-09-29 NOTE — Telephone Encounter (Signed)
I reached out to the pt and advised on labs. Pt verbalized understanding.

## 2018-09-29 NOTE — Telephone Encounter (Signed)
-----   Message from Kathrynn Ducking, MD sent at 09/25/2018  7:06 AM EDT ----- Blood work is unremarkable with exception of a mild elevation in chloride level, not likely to be clinically significant.  Please call the patient. ----- Message ----- From: Lavone Neri Lab Results In Sent: 09/25/2018   5:38 AM EDT To: Kathrynn Ducking, MD

## 2018-10-19 ENCOUNTER — Telehealth: Payer: Self-pay

## 2018-10-19 NOTE — Telephone Encounter (Signed)
Received fax from [facility] stating Cellcept was shipped to the pt on 10/15/2018.

## 2018-11-24 ENCOUNTER — Telehealth: Payer: Self-pay

## 2018-11-24 NOTE — Telephone Encounter (Signed)
Received fax from [facility] stating Cellcept was shipped to the pt on 11/17/2018.

## 2018-11-26 ENCOUNTER — Other Ambulatory Visit: Payer: Self-pay | Admitting: Neurology

## 2018-11-26 NOTE — Telephone Encounter (Signed)
Anda Latina, RN     11/24/18 1:01 PM Note   Received [facility] from [facility] stating Cellcept was shipped to the pt on 11/17/2018.

## 2018-12-18 ENCOUNTER — Other Ambulatory Visit: Payer: Self-pay | Admitting: Neurology

## 2018-12-23 ENCOUNTER — Telehealth: Payer: Self-pay | Admitting: Neurology

## 2018-12-23 NOTE — Telephone Encounter (Signed)
Pt is calling in to cancel appt, he states he is canceling because he isnt going to wear a mask

## 2018-12-23 NOTE — Telephone Encounter (Signed)
Noted  

## 2018-12-28 ENCOUNTER — Ambulatory Visit: Payer: BC Managed Care – PPO | Admitting: Neurology

## 2018-12-29 ENCOUNTER — Telehealth: Payer: Self-pay

## 2018-12-29 NOTE — Telephone Encounter (Signed)
Received fax from [facility] stating CellCept was shipped to the pt on 12/21/2018.

## 2019-02-23 ENCOUNTER — Telehealth: Payer: Self-pay

## 2019-02-23 NOTE — Telephone Encounter (Signed)
[facility]cept 500 mg was shipped to the pt on 02/15/2019.

## 2019-03-15 ENCOUNTER — Other Ambulatory Visit: Payer: Self-pay | Admitting: Neurology

## 2019-05-24 ENCOUNTER — Ambulatory Visit: Payer: BC Managed Care – PPO | Admitting: Neurology

## 2019-05-24 ENCOUNTER — Encounter: Payer: Self-pay | Admitting: Neurology

## 2019-05-24 ENCOUNTER — Other Ambulatory Visit: Payer: Self-pay

## 2019-05-24 VITALS — BP 122/81 | HR 103 | Temp 97.1°F | Ht 72.0 in | Wt 270.0 lb

## 2019-05-24 DIAGNOSIS — G7000 Myasthenia gravis without (acute) exacerbation: Secondary | ICD-10-CM

## 2019-05-24 DIAGNOSIS — Z5181 Encounter for therapeutic drug level monitoring: Secondary | ICD-10-CM

## 2019-05-24 MED ORDER — PYRIDOSTIGMINE BROMIDE 60 MG PO TABS: ORAL_TABLET | ORAL | 1 refills | Status: DC

## 2019-05-24 NOTE — Progress Notes (Signed)
Reason for visit: Myasthenia gravis  Randall Phillips is an 64 y.o. male  History of present illness:  [name] is a 64 year old right-handed white male with a history of myasthenia gravis.  The patient has done relatively well, he has been able to come off of prednisone completely.  He remains on CellCept and he will take Mestinon intermittently.  The patient still has some issues with chewing, he will have to assist his ability to chew with his hand, he is not choking with swallowing.  He has a rotator cuff issue on the right shoulder, he has not had surgery for this but he has difficulty raising the arm up.  The patient continues to work from home, he does not have to travel much at this point.  He returns for an evaluation.  He has not gotten the Covid vaccination, he claims that the Covid virus is a hoax, and that the vaccination is poison.  Past Medical History:  Diagnosis Date  . Diabetes mellitus without complication (HCC)    diet controlled  . History of renal calculi   . Hx of renal calculi   . Myasthenia gravis ([place])   . Obesity   . 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 2010  . Vision abnormalities    Left eye blind    Past Surgical History:  Procedure Laterality Date  . THYMECTOMY     Status post  . TONSILLECTOMY      Family History  Problem Relation Age of Onset  . Cancer - Lung Mother   . Diabetes Mother   . Cerebrovascular Disease Father   . Stroke Father 71  . Diabetes Father     Social history:  reports that he has never smoked. He has never used smokeless tobacco. He reports that he does not drink alcohol or use drugs.    Allergies  Allergen Reactions  . Other     All seafood    Medications:  Prior to Admission medications   Medication Sig Start Date End Date Taking? Authorizing Provider  Calcium Carbonate-Vitamin D (CALTRATE 600+D) 600-400 MG-UNIT per tablet  Take 1 tablet by mouth daily.   Yes [provider]  COCONUT OIL PO Take by mouth daily.   Yes [provider]  fish oil-omega-3 fatty acids 1000 MG capsule Take 2 g by mouth daily.   Yes [provider]  Menaquinone-7 (VITAMIN K2 PO) Take 100 mcg by mouth daily.   Yes [provider]  mycophenolate (CELLCEPT) 500 MG tablet Take by mouth.   Yes [provider]  predniSONE (DELTASONE) 2.5 MG tablet 3 tablets every other day 06/24/18  Yes Kathrynn Ducking, MD  pyridostigmine (MESTINON) 60 MG tablet TAKE 1 TABLET BY MOUTH EVERY 3-4 HOURS AS NEEDED 12/01/17  Yes Kathrynn Ducking, MD    ROS:  Out of a complete 14 system review of symptoms, the patient complains only of the following symptoms, and all other reviewed systems are negative.  Difficulty chewing Right shoulder pain  Blood pressure 122/81, pulse (!) 103, temperature (!) 97.1 F (36.2 C), height 6' (1.829 m), weight 270 lb (122.5 kg).  Physical Exam  General: The patient is alert and cooperative at the time of the examination.  The patient is markedly obese.  Skin: No significant peripheral edema is noted.   Neurologic Exam  Mental status: The patient is alert and oriented x 3 at the  time of the examination. The patient has apparent normal recent and remote memory, with an apparently normal attention span and concentration ability.   Cranial nerves: Facial symmetry is present. Speech is normal, no aphasia or dysarthria is noted. Extraocular movements are full with the right eye, on primary gaze, there is medial deviation of the left eye. Visual fields are full.  There is slight weakness with eye closure bilaterally.  Motor: The patient has good strength in all 4 extremities, with exception of 4/5 strength with the right deltoid muscle.  Sensory examination: Soft touch sensation is symmetric on the face, arms, and legs.  Coordination: The patient has good finger-nose-finger and  heel-to-shin bilaterally.  Gait and station: The patient has a normal gait. Tandem gait is slightly unsteady.  Romberg is negative. No drift is seen.  Reflexes: Deep tendon reflexes are symmetric.   Assessment/Plan:  1.  Myasthenia gravis  The patient is stable clinically, he continues to have some weakness with chewing and some slight facial weakness.  He will continue the Mestinon and the CellCept, he is off prednisone.  I have strongly suggested that he get the Covid vaccination, he is not willing to consider this.  The patient is immunosuppressed.  He will follow-up here in 6 months, blood work will be done today.  A prescription for Mestinon was sent in.  Randall Palau MD 05/24/2019 10:13 AM  [facility] [address] [place], [place] [address]  Phone [phone] Fax [phone]

## 2019-05-25 LAB — COMPREHENSIVE METABOLIC PANEL
ALT: 15 IU/L (ref 0–44)
AST: 19 IU/L (ref 0–40)
Albumin/Globulin Ratio: 1.7 (ref 1.2–2.2)
Albumin: 4.3 g/dL (ref 3.8–4.8)
Alkaline Phosphatase: 70 IU/L (ref 39–117)
BUN/Creatinine Ratio: 9 — ABNORMAL LOW (ref 10–24)
BUN: 9 mg/dL (ref 8–27)
Bilirubin Total: 0.6 mg/dL (ref 0.0–1.2)
CO2: 21 mmol/L (ref 20–29)
Calcium: 9.3 mg/dL (ref 8.6–10.2)
Chloride: 104 mmol/L (ref 96–106)
Creatinine, Ser: 1.04 mg/dL (ref 0.76–1.27)
GFR calc Af Amer: 87 mL/min/{1.73_m2} (ref >59)
GFR calc non Af Amer: 75 mL/min/{1.73_m2} (ref >59)
Globulin, Total: 2.5 g/dL (ref 1.5–4.5)
Glucose: 122 mg/dL — ABNORMAL HIGH (ref 65–99)
Potassium: 4.2 mmol/L (ref 3.5–5.2)
Sodium: 140 mmol/L (ref 134–144)
Total Protein: 6.8 g/dL (ref 6.0–8.5)

## 2019-05-25 LAB — CBC WITH DIFFERENTIAL/PLATELET
Basophils Absolute: 0.1 10*3/uL (ref 0.0–0.2)
Basos: 1 %
EOS (ABSOLUTE): 0.1 10*3/uL (ref 0.0–0.4)
Eos: 2 %
Hematocrit: 48.7 % (ref 37.5–51.0)
Hemoglobin: 15.9 g/dL (ref 13.0–17.7)
Immature Grans (Abs): 0 10*3/uL (ref 0.0–0.1)
Immature Granulocytes: 0 %
Lymphocytes Absolute: 1.3 10*3/uL (ref 0.7–3.1)
Lymphs: 22 %
MCH: 29.9 pg (ref 26.6–33.0)
MCHC: 32.6 g/dL (ref 31.5–35.7)
MCV: 92 fL (ref 79–97)
Monocytes Absolute: 0.5 10*3/uL (ref 0.1–0.9)
Monocytes: 9 %
Neutrophils Absolute: 3.9 10*3/uL (ref 1.4–7.0)
Neutrophils: 66 %
Platelets: 255 10*3/uL (ref 150–450)
RBC: 5.31 x10E6/uL (ref 4.14–5.80)
RDW: 13.4 % (ref 11.6–15.4)
WBC: 5.9 10*3/uL (ref 3.4–10.8)

## 2019-08-06 ENCOUNTER — Telehealth: Payer: Self-pay | Admitting: Neurology

## 2019-08-06 NOTE — Telephone Encounter (Signed)
I called the patient.  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  The CellCept was discontinued on 07/09/2018.  The patient is doing well so far, but the effects of the CellCept are probably not dissipated yet.  I will get an earlier revisit in 2 or 2-1/2 months to reevaluate him.

## 2019-08-10 NOTE — Telephone Encounter (Signed)
Pt has an appt with Sarah NP in September 2021 and has been added to waiting list.

## 2019-11-24 ENCOUNTER — Ambulatory Visit: Payer: BC Managed Care – PPO | Admitting: Neurology

## 2019-12-27 ENCOUNTER — Telehealth: Payer: Self-pay | Admitting: Neurology

## 2019-12-27 NOTE — Telephone Encounter (Signed)
Pt request refill pyridostigmine [facility]IN[facility] [phone]

## 2019-12-28 MED ORDER — PYRIDOSTIGMINE BROMIDE 60 MG PO TABS: ORAL_TABLET | ORAL | 3 refills | Status: DC

## 2019-12-28 NOTE — Telephone Encounter (Signed)
Mestinon was ordered.

## 2020-02-23 NOTE — Progress Notes (Signed)
PATIENT: Randall Phillips DOB: [birth year]  REASON FOR VISIT: follow up HISTORY FROM: patient  HISTORY OF PRESENT ILLNESS: Today 02/24/20 Randall Phillips is a 64 year old male with history of myasthenia gravis.  He has been able to come off prednisone completely. He had left auriculectomy for squamous cell carcinoma back in April, since then has been off CellCept.  Has not been able to tell any difference in his MG since remaining off.  He does take Mestinon 60 mg 4 times daily.  Still has to use his hand to assist with chewing.  No trouble swallowing or choking.  At his worst with MG, he had slurred speech.  From his recent surgery, he has some left facial droop.  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 Has not weakness to arms or legs.  Presents today for evaluation unaccompanied.  HISTORY 05/24/2019 Dr. Anne Hahn: Randall Phillips is a 64 year old right-handed white male with a history of myasthenia gravis.  The patient has done relatively well, he has been able to come off of prednisone completely.  He remains on CellCept and he will take Mestinon intermittently.  The patient still has some issues with chewing, he will have to assist his ability to chew with his hand, he is not choking with swallowing.  He has a rotator cuff issue on the right shoulder, he has not had surgery for this but he has difficulty raising the arm up.  The patient continues to work from home, he does not have to travel much at this point.  He returns for an evaluation.  He has not gotten the Covid vaccination, he claims that the Covid virus is a hoax, and that the vaccination is poison.   REVIEW OF SYSTEMS: Out of a complete 14 system review of symptoms, the patient complains only of the following symptoms, and all other reviewed systems are negative.  N/A  ALLERGIES: Allergies  Allergen Reactions   Other     All seafood    HOME MEDICATIONS: Outpatient Medications Prior to Visit  Medication Sig Dispense  Refill   Calcium Carbonate-Vitamin D 600-400 MG-UNIT tablet Take 1 tablet by mouth daily.     COCONUT OIL PO Take by mouth daily.     fish oil-omega-3 fatty acids 1000 MG capsule Take 2 g by mouth daily.     Menaquinone-7 (VITAMIN K2 PO) Take 100 mcg by mouth daily.     pyridostigmine (MESTINON) 60 MG tablet TAKE 1 TABLET BY MOUTH EVERY 3-4 HOURS AS NEEDED 720 tablet 3   No facility-administered medications prior to visit.    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 2010   Vision abnormalities    Left eye blind    PAST SURGICAL HISTORY: Past Surgical History:  Procedure Laterality Date   THYMECTOMY     Status post   TONSILLECTOMY      FAMILY HISTORY: Family History  Problem Relation Age of Onset   Cancer - Lung Mother    Diabetes Mother    Cerebrovascular Disease Father    Stroke Father 55   Diabetes Father     SOCIAL HISTORY: Social History  Socioeconomic History   Marital status: Married    Spouse name: Not on file   Number of children: 0   Years of education: College   Highest education level: Not on file  Occupational History    Employer: [employer]  Tobacco Use   Smoking status: Never Smoker   Smokeless tobacco: Never Used  Substance and Sexual Activity   Alcohol use: No   Drug use: No   Sexual activity: Not on file  Other Topics Concern   Not on file  Social History Narrative   Patient is right handed.   Patient drinks caffeine occasionally.   Social Determinants of Health   Financial Resource Strain: Not on file  Food Insecurity: Not on file  Transportation Needs: Not on file  Physical Activity: Not on file   Stress: Not on file  Social Connections: Not on file  Intimate Partner Violence: Not on file   PHYSICAL EXAM  Vitals:   02/24/20 1310  BP: 134/85  Pulse: 96  Weight: 252 lb 6.4 oz (114.5 kg)  Height: 6' (1.829 m)   Body mass index is 34.23 kg/m.  Generalized: Well developed, in no acute distress   Neurological examination  Mentation: Alert oriented to time, place, history taking. Follows all commands speech and language fluent Cranial nerve II-XII: Pupils were equal round reactive to light. Extraocular movements were full with the right eye, primary gaze left eye deviates medially, visual field were full. Facial sensation and strength were normal. Head turning and shoulder shrug  were normal and symmetric. Slight cheek puff weakness, facial asymmetry to the left mouth. Motor: The motor testing reveals 5 over 5 strength of all 4 extremities. Good symmetric motor tone is noted throughout.  Sensory: Sensory testing is intact to soft touch on all 4 extremities. No evidence of extinction is noted.  Coordination: Cerebellar testing reveals good finger-nose-finger and heel-to-shin bilaterally.  Gait and station: Gait is normal.  Reflexes: Deep tendon reflexes are symmetric and normal bilaterally.   DIAGNOSTIC DATA (LABS, IMAGING, TESTING) - I reviewed patient records, labs, notes, testing and imaging myself where available.  Lab Results  Component Value Date   WBC 5.9 05/24/2019   HGB 15.9 05/24/2019   HCT 48.7 05/24/2019   MCV 92 05/24/2019   PLT 255 05/24/2019      Component Value Date/Time   NA 140 05/24/2019 1018   K 4.2 05/24/2019 1018   CL 104 05/24/2019 1018   CO2 21 05/24/2019 1018   GLUCOSE 122 (H) 05/24/2019 1018   GLUCOSE 260 (H) 11/11/2009 1032   BUN 9 05/24/2019 1018   CREATININE 1.04 05/24/2019 1018   CALCIUM 9.3 05/24/2019 1018   PROT 6.8 05/24/2019 1018   ALBUMIN 4.3 05/24/2019 1018   AST 19 05/24/2019 1018   ALT 15 05/24/2019 1018   ALKPHOS 70  05/24/2019 1018   BILITOT 0.6 05/24/2019 1018   GFRNONAA 75 05/24/2019 1018   GFRAA 87 05/24/2019 1018   No results found for: CHOL, HDL, LDLCALC, LDLDIRECT, TRIG, CHOLHDL Lab Results  Component Value Date   HGBA1C 7.7 (H) 05/27/2016   No results found for: VITAMINB12 No results found for: TSH  ASSESSMENT AND PLAN 64 y.o. year old male  has a past medical history of Diabetes mellitus without complication (HCC), History of renal calculi, renal calculi, Myasthenia gravis (HCC), Obesity, Ventilator dependence (HCC), and Vision abnormalities. here with:  1.  Myasthenia gravis  He has remained off CellCept since April 2021, following  his auriculectomy for squamous cell carcinoma.  He has NOT noted any change in his MG.  Desires to remain off CellCept.  For now, he can remain off the CellCept, but continue Mestinon.  He should closely monitor his symptoms, let me know immediately if notes a decline in his MG.  He will follow-up in 6 months or sooner if needed.  I spent 20 minutes of face-to-face and non-face-to-face time with patient.  This included previsit chart review, lab review, study review, order entry, electronic health record documentation, patient education.  Margie Ege, AGNP-C, DNP 02/24/2020, 1:35 PM [facility] [address], [address] [place], [place] [address] [phone]

## 2020-02-24 ENCOUNTER — Encounter: Payer: Self-pay | Admitting: Neurology

## 2020-02-24 ENCOUNTER — Ambulatory Visit (INDEPENDENT_AMBULATORY_CARE_PROVIDER_SITE_OTHER): Payer: BC Managed Care – PPO | Admitting: Neurology

## 2020-02-24 VITALS — BP 134/85 | HR 96 | Ht 72.0 in | Wt 252.4 lb

## 2020-02-24 DIAGNOSIS — G7000 Myasthenia gravis without (acute) exacerbation: Secondary | ICD-10-CM | POA: Diagnosis not present

## 2020-02-24 NOTE — Patient Instructions (Signed)
Continue Mestinon  Call for worsening symptoms  See you back in 6 months

## 2020-02-25 NOTE — Progress Notes (Signed)
I have read the note, and I agree with the clinical assessment and plan.  Miliyah Luper K Renee Beale   

## 2020-08-24 ENCOUNTER — Ambulatory Visit: Payer: BC Managed Care – PPO | Admitting: Neurology

## 2020-08-24 ENCOUNTER — Encounter: Payer: Self-pay | Admitting: Neurology

## 2020-08-24 ENCOUNTER — Other Ambulatory Visit: Payer: Self-pay

## 2020-08-24 VITALS — BP 174/77 | HR 128 | Ht 72.0 in | Wt 250.0 lb

## 2020-08-24 DIAGNOSIS — G7000 Myasthenia gravis without (acute) exacerbation: Secondary | ICD-10-CM

## 2020-08-24 MED ORDER — PYRIDOSTIGMINE BROMIDE 60 MG PO TABS: ORAL_TABLET | ORAL | 3 refills | Status: DC

## 2020-08-24 NOTE — Progress Notes (Signed)
I have read the note, and I agree with the clinical assessment and plan.  Randall Phillips   

## 2020-08-24 NOTE — Patient Instructions (Signed)
Continue Mestinon  Stay off Cellcept Call for any worsening symptoms See you back in 1 year

## 2020-08-24 NOTE — Progress Notes (Signed)
PATIENT: Randall Phillips DOB: [birth year]  REASON FOR VISIT: Follow up HISTORY FROM: Patient Primary Neurologist: Dr. Anne Hahn   HISTORY OF PRESENT ILLNESS: Today 08/24/20 Randall Phillips is a 65 year old male with history of MG. Off Cellcept since April 2021 for left auriculectomy for squamous cell carcinoma, remains on Mestinon. No difference in MG since off Cellcept, says he won't go back on regardless.  Takes Mestinon at least 3 times daily, sometimes 4 if he notices slurred speech after prolonged talking.  He still have some difficulty with chewing, has been going on for years.  Sometimes he will have to use his hand to assist his jaw.  He denies any choking.  No weakness to his arms or legs.  He has retired now. HR up some, upset about mask policy. Here today alone.   Update 02/24/2020 SS: Randall Phillips is a 65 year old male with history of myasthenia gravis.  He has been able to come off prednisone completely. He had left auriculectomy for squamous cell carcinoma back in April, since then has been off CellCept.  Has not been able to tell any difference in his MG since remaining off.  He does take Mestinon 60 mg 4 times daily.  Still has to use his hand to assist with chewing.  No trouble swallowing or choking.  At his worst with MG, he had slurred speech.  From his recent surgery, he has some left facial droop.  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 Has not weakness to arms or legs.  Presents today for evaluation unaccompanied.  HISTORY 05/24/2019 Dr. Anne Hahn: [name] is a 65 year old right-handed white male with a history of myasthenia gravis.  The patient has done relatively well, he has been able to come off of prednisone completely.  He remains on CellCept and he will take Mestinon intermittently.  The patient still has some issues with chewing, he will have to assist his ability to chew with his hand, he is not choking with swallowing.  He has a rotator cuff issue on the  right shoulder, he has not had surgery for this but he has difficulty raising the arm up.  The patient continues to work from home, he does not have to travel much at this point.  He returns for an evaluation.  He has not gotten the Covid vaccination, he claims that the Covid virus is a hoax, and that the vaccination is poison.   REVIEW OF SYSTEMS: Out of a complete 14 system review of symptoms, the patient complains only of the following symptoms, and all other reviewed systems are negative.  N/A  ALLERGIES: Allergies  Allergen Reactions   Other     All seafood    HOME MEDICATIONS: Outpatient Medications Prior to Visit  Medication Sig Dispense Refill   Calcium Carbonate-Vitamin D 600-400 MG-UNIT tablet Take 1 tablet by mouth daily.     COCONUT OIL PO Take by mouth daily.     fish oil-omega-3 fatty acids 1000 MG capsule Take 2 g by mouth daily.     Menaquinone-7 (VITAMIN K2 PO) Take 100 mcg by mouth daily.     pyridostigmine (MESTINON) 60 MG tablet TAKE 1 TABLET BY MOUTH EVERY 3-4 HOURS AS NEEDED 720 tablet 3   No facility-administered medications prior to visit.    PAST MEDICAL HISTORY: Past Medical History:  Diagnosis Date   Diabetes mellitus without complication (HCC)    diet controlled   History of renal calculi  Hx of renal calculi    Myasthenia gravis (HCC)    Obesity    Ventilator dependence (HCC)    History of ventilator dependent respiratory failure secondary to myasthenia gravis, Oct. 2010   Vision abnormalities    Left eye blind    PAST SURGICAL HISTORY: Past Surgical History:  Procedure Laterality Date   THYMECTOMY     Status post   TONSILLECTOMY      FAMILY HISTORY: Family History  Problem Relation Age of Onset   Cancer - Lung Mother    Diabetes Mother    Cerebrovascular Disease Father    Stroke Father 51   Diabetes Father     SOCIAL HISTORY: Social History   Socioeconomic History   Marital status: Married    Spouse name: Not on file    Number of children: 0   Years of education: College   Highest education level: Not on file  Occupational History    Employer: [employer]  Tobacco Use   Smoking status: Never   Smokeless tobacco: Never  Substance and Sexual Activity   Alcohol use: No   Drug use: No   Sexual activity: Not on file  Other Topics Concern   Not on file  Social History Narrative   Patient is right handed.   Patient drinks caffeine occasionally.   Social Determinants of Health   Financial Resource Strain: Not on file  Food Insecurity: Not on file  Transportation Needs: Not on file  Physical Activity: Not on file  Stress: Not on file  Social Connections: Not on file  Intimate Partner Violence: Not on file   PHYSICAL EXAM  Vitals:   08/24/20 1328  BP: (!) 174/77  Pulse: (!) 128  Weight: 250 lb (113.4 kg)  Height: 6' (1.829 m)    Body mass index is 33.91 kg/m.  Generalized: Well developed, in no acute distress   Neurological examination  Mentation: Alert oriented to time, place, history taking. Follows all commands speech and language fluent Cranial nerve II-XII: Pupils were equal round reactive to light. Extraocular movements were full with the right eye, primary gaze left eye deviates medially does not fully abduct laterally, visual field were full. Facial sensation and strength were normal. Head turning and shoulder shrug  were normal and symmetric.  No significant cheek puff weakness, slight facial asymmetry to the left mouth. Motor: The motor testing reveals 5 over 5 strength of all 4 extremities. Good symmetric motor tone is noted throughout.  Sensory: Sensory testing is intact to soft touch on all 4 extremities. No evidence of extinction is noted.  Coordination: Cerebellar testing reveals good finger-nose-finger and heel-to-shin bilaterally.  Gait and station: Gait is normal.  Reflexes: Deep tendon reflexes are symmetric and normal bilaterally.   DIAGNOSTIC DATA (LABS,  IMAGING, TESTING) - I reviewed patient records, labs, notes, testing and imaging myself where available.  Lab Results  Component Value Date   WBC 5.9 05/24/2019   HGB 15.9 05/24/2019   HCT 48.7 05/24/2019   MCV 92 05/24/2019   PLT 255 05/24/2019      Component Value Date/Time   NA 140 05/24/2019 1018   K 4.2 05/24/2019 1018   CL 104 05/24/2019 1018   CO2 21 05/24/2019 1018   GLUCOSE 122 (H) 05/24/2019 1018   GLUCOSE 260 (H) 11/11/2009 1032   BUN 9 05/24/2019 1018   CREATININE 1.04 05/24/2019 1018   CALCIUM 9.3 05/24/2019 1018   PROT 6.8 05/24/2019 1018   ALBUMIN  4.3 05/24/2019 1018   AST 19 05/24/2019 1018   ALT 15 05/24/2019 1018   ALKPHOS 70 05/24/2019 1018   BILITOT 0.6 05/24/2019 1018   GFRNONAA 75 05/24/2019 1018   GFRAA 87 05/24/2019 1018   No results found for: CHOL, HDL, LDLCALC, LDLDIRECT, TRIG, CHOLHDL Lab Results  Component Value Date   HGBA1C 7.7 (H) 05/27/2016   No results found for: VITAMINB12 No results found for: TSH  ASSESSMENT AND PLAN 65 y.o. year old male  has a past medical history of Diabetes mellitus without complication (HCC), History of renal calculi, renal calculi, Myasthenia gravis (HCC), Obesity, Ventilator dependence (HCC), and Vision abnormalities. here with:  1.  Myasthenia gravis  -Remains overall stable, off CellCept since April 2021 -There has been no worsening of his MG symptoms -Continue Mestinon 60 mg 3-4 times daily  -Closely monitor for any worsening MG symptoms -Follow-up in 1 year or sooner if needed  Margie Ege, AGNP-C, DNP 08/24/2020, 1:51 PM [facility] [address] [place], [place] [address] [phone]

## 2021-08-30 ENCOUNTER — Ambulatory Visit: Payer: Medicare PPO | Admitting: Neurology

## 2021-09-12 NOTE — Progress Notes (Signed)
PATIENT: Randall Phillips DOB: [birth year]  REASON FOR VISIT: Follow up HISTORY FROM: Patient Primary Neurologist: Dr. Anne Hahn   HISTORY OF PRESENT ILLNESS: Today 09/12/21 Randall Phillips   Update 08/24/20 SS; Randall Phillips is a 66 year old male with history of MG. Off Cellcept since April 2021 for left auriculectomy for squamous cell carcinoma, remains on Mestinon. No difference in MG since off Cellcept, says he won't go back on regardless.  Takes Mestinon at least 3 times daily, sometimes 4 if he notices slurred speech after prolonged talking.  He still have some difficulty with chewing, has been going on for years.  Sometimes he will have to use his hand to assist his jaw.  He denies any choking.  No weakness to his arms or legs.  He has retired now. HR up some, upset about mask policy. Here today alone.   Update 02/24/2020 SS: Randall Phillips is a 66 year old male with history of myasthenia gravis.  He has been able to come off prednisone completely. He had left auriculectomy for squamous cell carcinoma back in April, since then has been off CellCept.  Has not been able to tell any difference in his MG since remaining off.  He does take Mestinon 60 mg 4 times daily.  Still has to use his hand to assist with chewing.  No trouble swallowing or choking.  At his worst with MG, he had slurred speech.  From his recent surgery, he has some left facial droop.  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 Has not weakness to arms or legs.  Presents today for evaluation unaccompanied.  HISTORY 05/24/2019 Dr. Anne Hahn: Randall Phillips is a 66 year old right-handed white male with a history of myasthenia gravis.  The patient has done relatively well, he has been able to come off of prednisone completely.  He remains on CellCept and he will take Mestinon intermittently.  The patient still has some issues with chewing, he will have to assist his ability to chew with his hand, he is not choking with  swallowing.  He has a rotator cuff issue on the right shoulder, he has not had surgery for this but he has difficulty raising the arm up.  The patient continues to work from home, he does not have to travel much at this point.  He returns for an evaluation.  He has not gotten the Covid vaccination, he claims that the Covid virus is a hoax, and that the vaccination is poison.   REVIEW OF SYSTEMS: Out of a complete 14 system review of symptoms, the patient complains only of the following symptoms, and all other reviewed systems are negative.  N/A  ALLERGIES: Allergies  Allergen Reactions   Other     All seafood    HOME MEDICATIONS: Outpatient Medications Prior to Visit  Medication Sig Dispense Refill   Calcium Carbonate-Vitamin D 600-400 MG-UNIT tablet Take 1 tablet by mouth daily.     COCONUT OIL PO Take by mouth daily.     fish oil-omega-3 fatty acids 1000 MG capsule Take 2 g by mouth daily.     Menaquinone-7 (VITAMIN K2 PO) Take 100 mcg by mouth daily.     pyridostigmine (MESTINON) 60 MG tablet TAKE 1 TABLET BY MOUTH EVERY 3-4 HOURS AS NEEDED 720 tablet 3   No facility-administered medications prior to visit.    PAST MEDICAL HISTORY: Past Medical History:  Diagnosis Date   Diabetes mellitus without complication (HCC)    diet controlled  History of renal calculi    Hx of renal calculi    Myasthenia gravis (HCC)    Obesity    Ventilator dependence (HCC)    History of ventilator dependent respiratory failure secondary to myasthenia gravis, Oct. 2010   Vision abnormalities    Left eye blind    PAST SURGICAL HISTORY: Past Surgical History:  Procedure Laterality Date   THYMECTOMY     Status post   TONSILLECTOMY      FAMILY HISTORY: Family History  Problem Relation Age of Onset   Cancer - Lung Mother    Diabetes Mother    Cerebrovascular Disease Father    Stroke Father 28   Diabetes Father     SOCIAL HISTORY: Social History   Socioeconomic History   Marital  status: Married    Spouse name: Not on file   Number of children: 0   Years of education: College   Highest education level: Not on file  Occupational History    Employer: [employer]  Tobacco Use   Smoking status: Never   Smokeless tobacco: Never  Substance and Sexual Activity   Alcohol use: No   Drug use: No   Sexual activity: Not on file  Other Topics Concern   Not on file  Social History Narrative   Patient is right handed.   Patient drinks caffeine occasionally.   Social Determinants of Health   Financial Resource Strain: Not on file  Food Insecurity: Not on file  Transportation Needs: Not on file  Physical Activity: Not on file  Stress: Not on file  Social Connections: Not on file  Intimate Partner Violence: Not on file   PHYSICAL EXAM  There were no vitals filed for this visit.   There is no height or weight on file to calculate BMI.  Generalized: Well developed, in no acute distress   Neurological examination  Mentation: Alert oriented to time, place, history taking. Follows all commands speech and language fluent Cranial nerve II-XII: Pupils were equal round reactive to light. Extraocular movements were full with the right eye, primary gaze left eye deviates medially does not fully abduct laterally, visual field were full. Facial sensation and strength were normal. Head turning and shoulder shrug  were normal and symmetric.  No significant cheek puff weakness, slight facial asymmetry to the left mouth. Motor: The motor testing reveals 5 over 5 strength of all 4 extremities. Good symmetric motor tone is noted throughout.  Sensory: Sensory testing is intact to soft touch on all 4 extremities. No evidence of extinction is noted.  Coordination: Cerebellar testing reveals good finger-nose-finger and heel-to-shin bilaterally.  Gait and station: Gait is normal.  Reflexes: Deep tendon reflexes are symmetric and normal bilaterally.   DIAGNOSTIC DATA (LABS,  IMAGING, TESTING) - I reviewed patient records, labs, notes, testing and imaging myself where available.  Lab Results  Component Value Date   WBC 5.9 05/24/2019   HGB 15.9 05/24/2019   HCT 48.7 05/24/2019   MCV 92 05/24/2019   PLT 255 05/24/2019      Component Value Date/Time   NA 140 05/24/2019 1018   K 4.2 05/24/2019 1018   CL 104 05/24/2019 1018   CO2 21 05/24/2019 1018   GLUCOSE 122 (H) 05/24/2019 1018   GLUCOSE 260 (H) 11/11/2009 1032   BUN 9 05/24/2019 1018   CREATININE 1.04 05/24/2019 1018   CALCIUM 9.3 05/24/2019 1018   PROT 6.8 05/24/2019 1018   ALBUMIN 4.3 05/24/2019 1018  AST 19 05/24/2019 1018   ALT 15 05/24/2019 1018   ALKPHOS 70 05/24/2019 1018   BILITOT 0.6 05/24/2019 1018   GFRNONAA 75 05/24/2019 1018   GFRAA 87 05/24/2019 1018   No results found for: "CHOL", "HDL", "LDLCALC", "LDLDIRECT", "TRIG", "CHOLHDL" Lab Results  Component Value Date   HGBA1C 7.7 (H) 05/27/2016   No results found for: "VITAMINB12" No results found for: "TSH"  ASSESSMENT AND PLAN 66 y.o. year old male  has a past medical history of Diabetes mellitus without complication (HCC), History of renal calculi, renal calculi, Myasthenia gravis (HCC), Obesity, Ventilator dependence (HCC), and Vision abnormalities. here with:  1.  Myasthenia gravis  -Remains overall stable, off CellCept since April 2021 -There has been no worsening of his MG symptoms -Continue Mestinon 60 mg 3-4 times daily  -Closely monitor for any worsening MG symptoms -Follow-up in 1 year or sooner if needed  Otila Kluver, DNP 09/12/2021, 2:53 PM [facility] [address], [address] [place], [place] [address] [phone]

## 2021-09-13 ENCOUNTER — Encounter: Payer: Self-pay | Admitting: Neurology

## 2021-09-13 ENCOUNTER — Ambulatory Visit: Payer: Medicare PPO | Admitting: Neurology

## 2021-09-13 VITALS — BP 132/79 | HR 103 | Ht 72.0 in | Wt 267.4 lb

## 2021-09-13 DIAGNOSIS — G7000 Myasthenia gravis without (acute) exacerbation: Secondary | ICD-10-CM

## 2022-09-17 ENCOUNTER — Encounter: Payer: Self-pay | Admitting: Neurology

## 2022-09-17 ENCOUNTER — Ambulatory Visit: Payer: Medicare PPO | Admitting: Neurology

## 2022-09-17 VITALS — BP 114/67 | HR 101 | Ht 72.0 in | Wt 254.6 lb

## 2022-09-17 DIAGNOSIS — G7000 Myasthenia gravis without (acute) exacerbation: Secondary | ICD-10-CM

## 2022-09-17 MED ORDER — PYRIDOSTIGMINE BROMIDE 60 MG PO TABS: ORAL_TABLET | ORAL | 3 refills | Status: DC

## 2022-09-17 NOTE — Progress Notes (Signed)
PATIENT: Randall Phillips DOB: 07-15-1955  REASON FOR VISIT: Follow up MG HISTORY FROM: Patient Primary Neurologist: Dr. Tommy Rainwater  HISTORY OF PRESENT ILLNESS: Today 09/17/22 Remains on Mestinon 60 mg 3-4 times a day. He will notice ptosis to the left eye, chewing is weak has to use his hand to help sometimes, slurring speech. Has general aging to his extremities, right shoulder issues. Doing overall well, stable. No new issues. No change since  stopping Cellcept.   Update 09/13/21 SS: Randall Phillips is here today for follow-up.  Remains stable in his MG.  Takes Mestinon 3-4 times a day.  He does experience slurring of speech.  Denies choking, does have weakness with chewing.  Symptoms have been stable.  No worsening since off CellCept in April 2021. He moved to be closer to family, may establish with a new neurologist in [place]. At his worst with MG, ocular, bulbar weakness, was on even on ventilator in 2010.  He had a thymectomy.   Update 08/24/20 SS; Randall Phillips is a 67 year old male with history of MG. Off Cellcept since April 2021 for left auriculectomy for squamous cell carcinoma, remains on Mestinon. No difference in MG since off Cellcept, says he won't go back on regardless.  Takes Mestinon at least 3 times daily, sometimes 4 if he notices slurred speech after prolonged talking.  He still have some difficulty with chewing, has been going on for years.  Sometimes he will have to use his hand to assist his jaw.  He denies any choking.  No weakness to his arms or legs.  He has retired now. HR up some, upset about mask policy. Here today alone.   Update 02/24/2020 SS: Randall Phillips is a 67 year old male with history of myasthenia gravis.  He has been able to come off prednisone completely. He had left auriculectomy for squamous cell carcinoma back in April, since then has been off CellCept.  Has not been able to tell any difference in his MG since remaining off.  He does take Mestinon 60  mg 4 times daily.  Still has to use his hand to assist with chewing.  No trouble swallowing or choking.  At his worst with MG, he had slurred speech.  From his recent surgery, he has some left facial droop.  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 Has not weakness to arms or legs.  Presents today for evaluation unaccompanied.  HISTORY 05/24/2019 Dr. Anne Hahn: Randall Phillips is a 67 year old right-handed white male with a history of myasthenia gravis.  The patient has done relatively well, he has been able to come off of prednisone completely.  He remains on CellCept and he will take Mestinon intermittently.  The patient still has some issues with chewing, he will have to assist his ability to chew with his hand, he is not choking with swallowing.  He has a rotator cuff issue on the right shoulder, he has not had surgery for this but he has difficulty raising the arm up.  The patient continues to work from home, he does not have to travel much at this point.  He returns for an evaluation.  He has not gotten the Covid vaccination, he claims that the Covid virus is a hoax, and that the vaccination is poison.   REVIEW OF SYSTEMS: Out of a complete 14 system review of symptoms, the patient complains only of the following symptoms, and all other reviewed systems are negative.  N/A  ALLERGIES:  Allergies  Allergen Reactions   Other     All seafood    HOME MEDICATIONS: Outpatient Medications Prior to Visit  Medication Sig Dispense Refill   fish oil-omega-3 fatty acids 1000 MG capsule Take 2 g by mouth daily.     Menaquinone-7 (VITAMIN K2 PO) Take 100 mcg by mouth daily.     pyridostigmine (MESTINON) 60 MG tablet TAKE 1 TABLET BY MOUTH EVERY 3-4 HOURS AS NEEDED 720 tablet 3   Calcium Carbonate-Vitamin D 600-400 MG-UNIT tablet Take 1 tablet by mouth daily.     COCONUT OIL PO Take by mouth daily.     No facility-administered medications prior to visit.    PAST MEDICAL HISTORY: Past  Medical History:  Diagnosis Date   Diabetes mellitus without complication (HCC)    diet controlled   History of renal calculi    Hx of renal calculi    Myasthenia gravis (HCC)    Obesity    Ventilator dependence (HCC)    History of ventilator dependent respiratory failure secondary to myasthenia gravis, Oct. 2010   Vision abnormalities    Left eye blind    PAST SURGICAL HISTORY: Past Surgical History:  Procedure Laterality Date   THYMECTOMY     Status post   TONSILLECTOMY      FAMILY HISTORY: Family History  Problem Relation Age of Onset   Cancer - Lung Mother    Diabetes Mother    Cerebrovascular Disease Father    Stroke Father 34   Diabetes Father     SOCIAL HISTORY: Social History   Socioeconomic History   Marital status: Married    Spouse name: Primary school teacher   Number of children: 0   Years of education: College   Highest education level: Not on file  Occupational History    Employer: [employer]  Tobacco Use   Smoking status: Never   Smokeless tobacco: Never  Vaping Use   Vaping Use: Never used  Substance and Sexual Activity   Alcohol use: No   Drug use: No   Sexual activity: Yes  Other Topics Concern   Not on file  Social History Narrative   Patient is right handed.   Patient drinks caffeine occasionally.   Social Determinants of Health   Financial Resource Strain: Not on file  Food Insecurity: Not on file  Transportation Needs: Not on file  Physical Activity: Not on file  Stress: Not on file  Social Connections: Not on file  Intimate Partner Violence: Not on file   PHYSICAL EXAM  Vitals:   09/17/22 1258  BP: 114/67  Pulse: (!) 101  Weight: 254 lb 9.6 oz (115.5 kg)  Height: 6' (1.829 m)    Body mass index is 34.53 kg/m.  Generalized: Well developed, in no acute distress   Neurological examination  Mentation: Alert oriented to time, place, history taking. Follows all commands speech and language fluent Cranial nerve II-XII:  Pupils were equal round reactive to light. Extraocular movements were full with the right eye, primary gaze left eye deviates medially does not fully abduct laterally, visual field were full. Facial sensation and strength were normal. Head turning and shoulder shrug  were normal and symmetric.  Asymmetry to the left face from surgical procedure.  There is bilateral eye open weakness. Cheek puff weakness.  Motor: Good strength overall, exception 3/5 right arm with shoulder weakness  Sensory: Sensory testing is intact to soft touch on all 4 extremities. No evidence of extinction is noted.  Coordination: Cerebellar testing reveals good finger-nose-finger and heel-to-shin bilaterally.  Gait and station: Gait is normal Reflexes: Deep tendon reflexes are symmetric and normal bilaterally.   DIAGNOSTIC DATA (LABS, IMAGING, TESTING) - I reviewed patient records, labs, notes, testing and imaging myself where available.  Lab Results  Component Value Date   WBC 5.9 05/24/2019   HGB 15.9 05/24/2019   HCT 48.7 05/24/2019   MCV 92 05/24/2019   PLT 255 05/24/2019      Component Value Date/Time   NA 140 05/24/2019 1018   K 4.2 05/24/2019 1018   CL 104 05/24/2019 1018   CO2 21 05/24/2019 1018   GLUCOSE 122 (H) 05/24/2019 1018   GLUCOSE 260 (H) 11/11/2009 1032   BUN 9 05/24/2019 1018   CREATININE 1.04 05/24/2019 1018   CALCIUM 9.3 05/24/2019 1018   PROT 6.8 05/24/2019 1018   ALBUMIN 4.3 05/24/2019 1018   AST 19 05/24/2019 1018   ALT 15 05/24/2019 1018   ALKPHOS 70 05/24/2019 1018   BILITOT 0.6 05/24/2019 1018   GFRNONAA 75 05/24/2019 1018   GFRAA 87 05/24/2019 1018   No results found for: "CHOL", "HDL", "LDLCALC", "LDLDIRECT", "TRIG", "CHOLHDL" Lab Results  Component Value Date   HGBA1C 7.7 (H) 05/27/2016   No results found for: "VITAMINB12" No results found for: "TSH"  ASSESSMENT AND PLAN 67 y.o. year old male  has a past medical history of Diabetes mellitus without complication (HCC),  History of renal calculi, renal calculi, Myasthenia gravis (HCC), Obesity, Ventilator dependence (HCC), and Vision abnormalities. here with:  1.  Myasthenia gravis  -Has continued to remain stable, off CellCept since April 2021, without worsening of his MG  -Continue Mestinon 60 mg 3-4 times daily -Monitor for any worsening symptoms, follow-up 1 year or sooner if needed, will see Dr. Terrace Arabia, to establish [facility] care since Eden has retired. He remembers seeing Dr. Terrace Arabia in the hospital.   Meds ordered this encounter  Medications   pyridostigmine (MESTINON) 60 MG tablet    Sig: TAKE 1 TABLET BY MOUTH EVERY 3-4 HOURS AS NEEDED    Dispense:  720 tablet    Refill:  3    Please keep refills on file, does not need right now     Margie Ege, AGNP-C, DNP 09/17/2022, 1:17 PM [facility] [address] [place], [place] [address] [phone]

## 2022-09-17 NOTE — Patient Instructions (Signed)
I am glad you are doing well today  Continue the Mestinon  See you back in 1 year Call for any worsening MG symptoms

## 2023-09-13 ENCOUNTER — Other Ambulatory Visit: Payer: Self-pay | Admitting: Neurology

## 2023-09-23 ENCOUNTER — Ambulatory Visit: Payer: Medicare PPO | Admitting: Neurology

## 2023-10-14 ENCOUNTER — Ambulatory Visit: Admitting: Neurology

## 2023-10-14 ENCOUNTER — Encounter: Payer: Self-pay | Admitting: Neurology

## 2023-10-14 VITALS — BP 138/81 | HR 90 | Resp 14 | Ht 72.0 in | Wt 252.0 lb

## 2023-10-14 DIAGNOSIS — G7000 Myasthenia gravis without (acute) exacerbation: Secondary | ICD-10-CM

## 2023-10-14 MED ORDER — PYRIDOSTIGMINE BROMIDE 60 MG PO TABS: ORAL_TABLET | ORAL | 3 refills | Status: AC

## 2023-10-14 NOTE — Progress Notes (Signed)
 Chief Complaint  Patient presents with   Myasthenia Gravis    Rm14, alone,  Mg 1 yr f/up: pt stated that he's stable, no concerns      ASSESSMENT AND PLAN  Randall Phillips is a 68 y.o. male   History of myasthenia gravis,  I was not able to find into his acetylcholine antibody in epic system, history of exacerbation, thymectomy in 2010  Moderate bulbar, proximal limb muscle weakness on examination,  He only wants Mestinon  60 mg 3 to 4 tablets a day as needed refilled,  Educated him about signs of exacerbation, he is very well educated about the myasthenia gravis, adamantly refused for further testing or medication changes  Return to clinic in 1 year  DIAGNOSTIC DATA (LABS, IMAGING, TESTING) - I reviewed patient records, labs, notes, testing and imaging myself where available.   MEDICAL HISTORY:  Randall Phillips is a 68 year old male, follow up for myasthenia gravis, was seen by Dr. Jenel and nurse practitioner Sarah in the past,  History is obtained from the patient and review of electronic medical records. I personally reviewed pertinent available imaging films in PACS.   PMHx of  DM Hx Kidney Bethena Deem with left eye issue (strabismus) Hx of cataract surgery  Patient is a excellent historian, well educated about his myasthenia gravis, he was a patient of Dr. Jenel for a long time,  He was diagnosed with myasthenia gravis in 2004, presenting with slurred speech, difficulty chewing, swallowing, ptosis, I did not find his myasthenia gravis panel in epic system,  Apparently he went to myasthenia gravis crisis in 2010 presenting with difficulty breathing in the setting of aspiration pneumonia, required intubation,  CT chest in  Sept 2010 1.  Diffuse right lung consolidation and low density material  filling the major right bronchial tree.  Favor severe pneumonia  and/or sequelae of large volume aspiration. I discussed the case  with Dr. Creston at 0125 hours on  12/08/2008.  2.  Early nodular opacity in the superior segment of the left lower  lobe suspicious for additional site of infection.  3.  No mediastinal mass or lymphadenopathy.   His symptoms stabilized by plasma exchange, underwent median sternotomy and thymectomy in Oct 2010 by Dr. Brantley,  Then his myasthenia gravis symptoms was controlled by Imuran and prednisone , was not under excellent control, later switched to CellCept , prednisone  and Mestinon ,  Reviewing previous note, his muscle weakness was under good control taking CellCept  500 mg 2 tablets/3, prednisone  20 mg daily, Mestinon   He developed left ear squamous cell carcinoma in 2021, required surgery in April 2021, since then has been off CellCept , described no difference felt after tapering off CellCept  and prednisone , since then he remained on Mestinon  60 mg 3 to 4 tablets a day  Went through pandemic, he reported  no longer trust healthcare, has not seen his primary care for many years, only getting Mestinon  refills through our office on a yearly basis  Last laboratory evaluation was in September 2021: Normal CBC, CMP with exception of mildly elevated glucose 107,   Today he was found to have moderate bulbar, mild to moderate proximal upper and lower extremity muscle weakness, I have suggested him laboratory evaluation for baseline, potential long-term immunosuppressive/modulation treatment, other than Mestinon  alone, he does not not want to have lab tests or medication changes  PHYSICAL EXAM:   Vitals:   10/14/23 1301  BP: 138/81  Pulse: 90  Resp: 14  SpO2: 94%  Weight: 252  lb (114.3 kg)  Height: 6' (1.829 m)     Body mass index is 34.18 kg/m.  PHYSICAL EXAMNIATION:  Gen: NAD, conversant, well nourised, well groomed                     Cardiovascular: Regular rate rhythm, no peripheral edema, warm, nontender. Eyes: Conjunctivae clear without exudates or hemorrhage Neck: Supple, no carotid bruits. Pulmonary:  Clear to auscultation bilaterally   NEUROLOGICAL EXAM:  MENTAL STATUS: Speech/cognition: Awake, alert, oriented to history taking and casual conversation CRANIAL NERVES: CN II: Visual fields are full to confrontation. Pupils are round equal and briskly reactive to light. OD 20/30,   OS 20/40 CN III, IV, VI: extraocular movement are normal.  Left esotropia CN V: Facial sensation is intact to light touch CN VII:  Moderate eye closure, cheek puff weakness, could not close his right eye completely, CN VIII: Hearing is normal to causal conversation. CN IX, X: Phonation is normal. CN XI: Head turning and shoulder shrug are intact  MOTOR: Mild to moderate neck flexion, bilateral shoulder abduction, external rotation, bilateral hip flexion weakness    REFLEXES: Reflexes are 1 and symmetric at the biceps, triceps, knees, and ankles. Plantar responses are flexor.  SENSORY: Intact to light touch, pinprick and vibratory sensation are intact in fingers and toes.  COORDINATION: There is no trunk or limb dysmetria noted.  GAIT/STANCE: Able to get up from seated position arm crossed,  REVIEW OF SYSTEMS:  Full 14 system review of systems performed and notable only for as above All other review of systems were negative.   ALLERGIES: Allergies  Allergen Reactions   Other     All seafood    HOME MEDICATIONS: Current Outpatient Medications  Medication Sig Dispense Refill   Cholecalciferol (VITAMIN D3) 30 MCG/15ML LIQD Take 30 mcg by mouth daily.     Menaquinone-7 (VITAMIN K2 PO) Take 100 mcg by mouth daily.     pyridostigmine  (MESTINON ) 60 MG tablet TAKE 1 TABLET BY MOUTH EVERY 3-4 HOURS AS NEEDED 720 tablet 2   No current facility-administered medications for this visit.    PAST MEDICAL HISTORY: Past Medical History:  Diagnosis Date   Diabetes mellitus without complication (HCC)    diet controlled   History of renal calculi    Hx of renal calculi    Myasthenia gravis (HCC)     Obesity    Ventilator dependence (HCC)    History of ventilator dependent respiratory failure secondary to myasthenia gravis, Oct. 2010   Vision abnormalities    Left eye blind    PAST SURGICAL HISTORY: Past Surgical History:  Procedure Laterality Date   THYMECTOMY     Status post   TONSILLECTOMY      FAMILY HISTORY: Family History  Problem Relation Age of Onset   Cancer - Lung Mother    Diabetes Mother    Cerebrovascular Disease Father    Stroke Father 2   Diabetes Father     SOCIAL HISTORY: Social History   Socioeconomic History   Marital status: Married    Spouse name: Primary school teacher   Number of children: 0   Years of education: College   Highest education level: Not on file  Occupational History    Employer: [employer]  Tobacco Use   Smoking status: Never   Smokeless tobacco: Never  Vaping Use   Vaping status: Never Used  Substance and Sexual Activity   Alcohol use: No   Drug use:  No   Sexual activity: Yes  Other Topics Concern   Not on file  Social History Narrative   Patient is right handed.   Patient drinks caffeine occasionally.   Social Drivers of Corporate investment banker Strain: Not on file  Food Insecurity: Not on file  Transportation Needs: Not on file  Physical Activity: Not on file  Stress: Not on file  Social Connections: Unknown (07/23/2021)   [facility] from [facility]   Social Network    Social Network: Not on file  Intimate Partner Violence: Unknown (06/14/2021)   Received from [facility]   HITS    Physically Hurt: Not on file    Insult or Talk Down To: Not on file    Threaten Physical Harm: Not on file    Scream or Curse: Not on file      Modena Callander, M.D. Ph.D.  [facility] [address], [address] [place], [place] [address] Ph: [phone] Fax: [phone]  CC:  No referring provider defined for this encounter.  Pcp, No

## 2024-10-19 ENCOUNTER — Ambulatory Visit: Admitting: Neurology
